# Patient Record
Sex: Female | Born: 1940 | Race: White | Hispanic: No | Marital: Married | Smoking: Former smoker
Health system: Southern US, Community
[De-identification: ages and names within clinical notes are randomized; demographics above are authoritative.]

## PROBLEM LIST (undated history)

## (undated) DIAGNOSIS — F419 Anxiety disorder, unspecified: Secondary | ICD-10-CM

## (undated) DIAGNOSIS — M1990 Unspecified osteoarthritis, unspecified site: Secondary | ICD-10-CM

## (undated) DIAGNOSIS — K2100 Gastro-esophageal reflux disease with esophagitis, without bleeding: Secondary | ICD-10-CM

## (undated) DIAGNOSIS — T753XXA Motion sickness, initial encounter: Secondary | ICD-10-CM

## (undated) DIAGNOSIS — R519 Headache, unspecified: Secondary | ICD-10-CM

## (undated) DIAGNOSIS — K219 Gastro-esophageal reflux disease without esophagitis: Secondary | ICD-10-CM

## (undated) DIAGNOSIS — I10 Essential (primary) hypertension: Secondary | ICD-10-CM

## (undated) DIAGNOSIS — R0600 Dyspnea, unspecified: Secondary | ICD-10-CM

## (undated) DIAGNOSIS — Z972 Presence of dental prosthetic device (complete) (partial): Secondary | ICD-10-CM

## (undated) DIAGNOSIS — R42 Dizziness and giddiness: Secondary | ICD-10-CM

## (undated) HISTORY — PX: NASAL FRACTURE SURGERY: SHX718

## (undated) HISTORY — PX: BACK SURGERY: SHX140

## (undated) HISTORY — PX: CHOLECYSTECTOMY: SHX55

## (undated) HISTORY — PX: APPENDECTOMY: SHX54

## (undated) HISTORY — PX: ABDOMINAL HYSTERECTOMY: SHX81

---

## 2008-10-26 ENCOUNTER — Ambulatory Visit: Payer: Self-pay | Admitting: Family Medicine

## 2008-11-14 ENCOUNTER — Emergency Department: Payer: Self-pay | Admitting: Unknown Physician Specialty

## 2009-04-01 ENCOUNTER — Ambulatory Visit: Payer: Self-pay | Admitting: Family Medicine

## 2010-04-05 ENCOUNTER — Ambulatory Visit: Payer: Self-pay | Admitting: Family Medicine

## 2011-04-06 ENCOUNTER — Ambulatory Visit: Payer: Self-pay | Admitting: Family Medicine

## 2011-05-24 ENCOUNTER — Ambulatory Visit: Payer: Self-pay | Admitting: Gastroenterology

## 2011-11-21 ENCOUNTER — Ambulatory Visit: Payer: Self-pay | Admitting: Family Medicine

## 2012-05-02 ENCOUNTER — Ambulatory Visit: Payer: Self-pay | Admitting: Family Medicine

## 2012-11-26 ENCOUNTER — Ambulatory Visit: Payer: Self-pay | Admitting: Family Medicine

## 2013-05-01 ENCOUNTER — Ambulatory Visit: Payer: Self-pay | Admitting: Gastroenterology

## 2013-05-05 LAB — PATHOLOGY REPORT

## 2013-05-06 ENCOUNTER — Ambulatory Visit: Payer: Self-pay | Admitting: Family Medicine

## 2013-10-21 ENCOUNTER — Ambulatory Visit: Payer: Self-pay | Admitting: Family Medicine

## 2014-04-24 ENCOUNTER — Other Ambulatory Visit: Payer: Self-pay | Admitting: Family Medicine

## 2014-04-24 DIAGNOSIS — Z1231 Encounter for screening mammogram for malignant neoplasm of breast: Secondary | ICD-10-CM

## 2014-05-12 ENCOUNTER — Ambulatory Visit
Admission: RE | Admit: 2014-05-12 | Discharge: 2014-05-12 | Disposition: A | Discharge status: Home / Self Care | Payer: Medicare HMO | Source: Ambulatory Visit | Attending: Family Medicine | Admitting: Family Medicine

## 2014-05-12 DIAGNOSIS — Z1231 Encounter for screening mammogram for malignant neoplasm of breast: Secondary | ICD-10-CM | POA: Insufficient documentation

## 2014-11-04 ENCOUNTER — Other Ambulatory Visit: Payer: Self-pay | Admitting: Family Medicine

## 2014-11-04 DIAGNOSIS — I358 Other nonrheumatic aortic valve disorders: Secondary | ICD-10-CM

## 2014-11-10 ENCOUNTER — Ambulatory Visit
Admission: RE | Admit: 2014-11-10 | Discharge: 2014-11-10 | Disposition: A | Discharge status: Home / Self Care | Payer: Medicare HMO | Source: Ambulatory Visit | Attending: Family Medicine | Admitting: Family Medicine

## 2014-11-10 DIAGNOSIS — I340 Nonrheumatic mitral (valve) insufficiency: Secondary | ICD-10-CM | POA: Diagnosis not present

## 2014-11-10 DIAGNOSIS — I071 Rheumatic tricuspid insufficiency: Secondary | ICD-10-CM | POA: Diagnosis not present

## 2014-11-10 DIAGNOSIS — I358 Other nonrheumatic aortic valve disorders: Secondary | ICD-10-CM

## 2014-11-10 NOTE — Progress Notes (Signed)
*  PRELIMINARY RESULTS* Echocardiogram 2D Echocardiogram has been performed.  Donna HousekeeperJerry R Hege 11/10/2014, 11:00 AM

## 2015-04-01 ENCOUNTER — Other Ambulatory Visit: Payer: Self-pay | Admitting: Family Medicine

## 2015-04-01 DIAGNOSIS — Z1231 Encounter for screening mammogram for malignant neoplasm of breast: Secondary | ICD-10-CM

## 2015-05-13 ENCOUNTER — Ambulatory Visit
Admission: RE | Admit: 2015-05-13 | Discharge: 2015-05-13 | Disposition: A | Discharge status: Home / Self Care | Payer: Medicare HMO | Source: Ambulatory Visit | Attending: Family Medicine | Admitting: Family Medicine

## 2015-05-13 DIAGNOSIS — Z1231 Encounter for screening mammogram for malignant neoplasm of breast: Secondary | ICD-10-CM | POA: Insufficient documentation

## 2015-05-19 ENCOUNTER — Other Ambulatory Visit: Payer: Self-pay | Admitting: Family Medicine

## 2015-05-19 DIAGNOSIS — R928 Other abnormal and inconclusive findings on diagnostic imaging of breast: Secondary | ICD-10-CM

## 2015-05-31 ENCOUNTER — Ambulatory Visit
Admission: RE | Admit: 2015-05-31 | Discharge: 2015-05-31 | Disposition: A | Discharge status: Home / Self Care | Payer: Medicare HMO | Source: Ambulatory Visit | Attending: Family Medicine | Admitting: Family Medicine

## 2015-05-31 DIAGNOSIS — R928 Other abnormal and inconclusive findings on diagnostic imaging of breast: Secondary | ICD-10-CM

## 2015-05-31 DIAGNOSIS — N6489 Other specified disorders of breast: Secondary | ICD-10-CM | POA: Diagnosis not present

## 2015-10-15 ENCOUNTER — Other Ambulatory Visit: Payer: Self-pay | Admitting: Family Medicine

## 2015-10-15 DIAGNOSIS — I10 Essential (primary) hypertension: Secondary | ICD-10-CM

## 2015-11-15 ENCOUNTER — Ambulatory Visit
Admission: RE | Admit: 2015-11-15 | Discharge: 2015-11-15 | Disposition: A | Discharge status: Home / Self Care | Payer: Medicare HMO | Source: Ambulatory Visit | Attending: Family Medicine | Admitting: Family Medicine

## 2015-11-15 DIAGNOSIS — I080 Rheumatic disorders of both mitral and aortic valves: Secondary | ICD-10-CM | POA: Diagnosis not present

## 2015-11-15 DIAGNOSIS — I10 Essential (primary) hypertension: Secondary | ICD-10-CM | POA: Insufficient documentation

## 2015-11-15 NOTE — Progress Notes (Signed)
  Echocardiogram 2D Echocardiogram has been performed.  Tye SavoyCasey N Donna Lowe 11/15/2015, 11:25 AM

## 2016-06-29 ENCOUNTER — Other Ambulatory Visit: Payer: Self-pay | Admitting: Family Medicine

## 2016-06-29 DIAGNOSIS — Z1231 Encounter for screening mammogram for malignant neoplasm of breast: Secondary | ICD-10-CM

## 2016-07-06 ENCOUNTER — Ambulatory Visit
Admission: RE | Admit: 2016-07-06 | Discharge: 2016-07-06 | Disposition: A | Discharge status: Home / Self Care | Payer: Medicare HMO | Source: Ambulatory Visit | Attending: Family Medicine | Admitting: Family Medicine

## 2016-07-06 DIAGNOSIS — Z1231 Encounter for screening mammogram for malignant neoplasm of breast: Secondary | ICD-10-CM | POA: Diagnosis present

## 2016-09-19 ENCOUNTER — Telehealth: Payer: Self-pay | Admitting: *Deleted

## 2016-09-19 DIAGNOSIS — Z122 Encounter for screening for malignant neoplasm of respiratory organs: Secondary | ICD-10-CM

## 2016-09-19 DIAGNOSIS — Z87891 Personal history of nicotine dependence: Secondary | ICD-10-CM

## 2016-09-19 NOTE — Telephone Encounter (Signed)
Received referral for initial lung cancer screening scan. Contacted patient and obtained smoking history,(former, quit 14 years ago, 46 pack year) as well as answering questions related to screening process. Patient denies signs of lung cancer such as weight loss or hemoptysis. Patient denies comorbidity that would prevent curative treatment if lung cancer were found. Patient is scheduled for shared decision making visit and CT scan on 09/27/16 at 1:30pm.

## 2016-09-27 ENCOUNTER — Encounter: Payer: Self-pay | Admitting: Nurse Practitioner

## 2016-09-27 ENCOUNTER — Inpatient Hospital Stay: Payer: Medicare HMO | Attending: Nurse Practitioner | Admitting: Nurse Practitioner

## 2016-09-27 ENCOUNTER — Ambulatory Visit
Admission: RE | Admit: 2016-09-27 | Discharge: 2016-09-27 | Disposition: A | Discharge status: Home / Self Care | Payer: Medicare HMO | Source: Ambulatory Visit | Attending: Nurse Practitioner | Admitting: Nurse Practitioner

## 2016-09-27 DIAGNOSIS — M47814 Spondylosis without myelopathy or radiculopathy, thoracic region: Secondary | ICD-10-CM | POA: Diagnosis not present

## 2016-09-27 DIAGNOSIS — Z9049 Acquired absence of other specified parts of digestive tract: Secondary | ICD-10-CM | POA: Insufficient documentation

## 2016-09-27 DIAGNOSIS — Z87891 Personal history of nicotine dependence: Secondary | ICD-10-CM | POA: Diagnosis not present

## 2016-09-27 DIAGNOSIS — Z122 Encounter for screening for malignant neoplasm of respiratory organs: Secondary | ICD-10-CM

## 2016-09-27 DIAGNOSIS — K760 Fatty (change of) liver, not elsewhere classified: Secondary | ICD-10-CM | POA: Diagnosis not present

## 2016-09-27 DIAGNOSIS — I700 Atherosclerosis of aorta: Secondary | ICD-10-CM | POA: Insufficient documentation

## 2016-09-27 NOTE — Progress Notes (Signed)
In accordance with CMS guidelines, patient has met eligibility criteria including age, absence of signs or symptoms of lung cancer.  Social History  Substance Use Topics  . Smoking status: Former Smoker    Packs/day: 1.00    Years: 46.00    Quit date: 2004  . Smokeless tobacco: Not on file  . Alcohol use Not on file     A shared decision-making session was conducted prior to the performance of CT scan. This includes one or more decision aids, includes benefits and harms of screening, follow-up diagnostic testing, over-diagnosis, false positive rate, and total radiation exposure.  Counseling on the importance of adherence to annual lung cancer LDCT screening, impact of co-morbidities, and ability or willingness to undergo diagnosis and treatment is imperative for compliance of the program.  Counseling on the importance of continued smoking cessation for former smokers; the importance of smoking cessation for current smokers, and information about tobacco cessation interventions have been given to patient including [place] and [facility] quit [place] programs.  Written order for lung cancer screening with LDCT has been given to the patient and any and all questions have been answered to the best of my abilities.   Yearly follow up will be coordinated by Burgess Estelle, Thoracic Navigator.  Verlon Au NP 09/27/16 4:15 PM

## 2016-09-28 ENCOUNTER — Telehealth: Payer: Self-pay | Admitting: *Deleted

## 2016-09-28 NOTE — Telephone Encounter (Signed)
Notified patient of LDCT lung cancer screening program results. Also notified of incidental findings noted below and is encouraged to discuss further with PCP who will receive a copy of this note and/or the CT report. Patient verbalizes understanding.   IMPRESSION: 1. Lung-RADS 2, benign appearance or behavior. Continue annual screening with low-dose chest CT without contrast in 12 months. 2. Diffuse hepatic steatosis.

## 2017-05-21 ENCOUNTER — Encounter: Payer: Self-pay | Admitting: Emergency Medicine

## 2017-05-21 ENCOUNTER — Ambulatory Visit
Admission: EM | Admit: 2017-05-21 | Discharge: 2017-05-21 | Disposition: A | Discharge status: Home / Self Care | Payer: Medicare HMO | Attending: Family Medicine | Admitting: Family Medicine

## 2017-05-21 ENCOUNTER — Other Ambulatory Visit: Payer: Self-pay

## 2017-05-21 DIAGNOSIS — B354 Tinea corporis: Secondary | ICD-10-CM

## 2017-05-21 HISTORY — DX: Essential (primary) hypertension: I10

## 2017-05-21 MED ORDER — NYSTATIN-TRIAMCINOLONE 100000-0.1 UNIT/GM-% EX CREA: TOPICAL_CREAM | CUTANEOUS | 0 refills | Status: DC

## 2017-05-21 MED ORDER — PREDNISONE 20 MG PO TABS: 20.0000 mg | ORAL_TABLET | Freq: Every day | ORAL | 0 refills | Status: DC

## 2017-05-21 MED ORDER — FLUCONAZOLE 150 MG PO TABS: ORAL_TABLET | ORAL | 1 refills | Status: DC

## 2017-05-21 NOTE — ED Triage Notes (Signed)
Patient in today c/o rash on the right side of her face and under her right breast x 4-5 days. Patient has been using OTC Benadryl spray.

## 2017-05-21 NOTE — ED Provider Notes (Signed)
[facility]    CSN: [id number] Arrival date & time: 05/21/17  0911     History   Chief Complaint Chief Complaint  Patient presents with  . Rash    HPI Donna Lowe is a 77 y.o. female.   77 yo female with a c/o itchy, red, rash under her right breast, on her neck and now on her face for the past 4-5 days. Has been using benadryl spray without any relief. Denies any fevers, chills, wheezing, swelling, pain, drainage, shortness of breath.  Denies any new soaps, detergents, plant exposures. Does state that prior to symptoms she had been applying a cream to her husband's toenails for toenail fungus.   The history is provided by the patient.    Past Medical History:  Diagnosis Date  . Hypertension     Patient Active Problem List   Diagnosis Date Noted  . Personal history of tobacco use 09/27/2016    Past Surgical History:  Procedure Laterality Date  . ABDOMINAL HYSTERECTOMY    . APPENDECTOMY    . BACK SURGERY    . CHOLECYSTECTOMY    . NASAL FRACTURE SURGERY      OB History   None      Home Medications    Prior to Admission medications   Medication Sig Start Date End Date Taking? Authorizing Provider  atenolol (TENORMIN) 25 MG tablet Take 1 tablet by mouth 3 (three) times daily.   Yes [provider]  furosemide (LASIX) 20 MG tablet Take 1 tablet by mouth 2 (two) times daily.   Yes [provider]  omeprazole (PRILOSEC) 20 MG capsule Take 1 capsule by mouth daily.   Yes [provider]  Probiotic Product (PROBIOTIC DAILY PO) Take 1 tablet by mouth daily.   Yes [provider]  cyclobenzaprine (FLEXERIL) 5 MG tablet Take 1 tablet by mouth daily as needed. 05/02/17   [provider]  fluconazole (DIFLUCAN) 150 MG tablet 1 tab po once; may repeat in 1 week 05/21/17   Payton Mccallum, MD  nystatin-triamcinolone [facility] II) cream Apply to affected area daily 05/21/17   Payton Mccallum, MD  predniSONE  (DELTASONE) 20 MG tablet Take 1 tablet (20 mg total) by mouth daily. 05/21/17   Payton Mccallum, MD    Family History Family History  Problem Relation Age of Onset  . Breast cancer Mother 25  . Breast cancer Sister 85    Social History Social History   Tobacco Use  . Smoking status: Former Smoker    Packs/day: 1.00    Years: 46.00    Pack years: 46.00    Last attempt to quit: 2004    Years since quitting: 15.3  . Smokeless tobacco: Never Used  Substance Use Topics  . Alcohol use: Never    Frequency: Never  . Drug use: Never     Allergies   Aspirin; Celecoxib; and Chlorthalidone   Review of Systems Review of Systems   Physical Exam Triage Vital Signs ED Triage Vitals  Enc Vitals Group     BP 05/21/17 0924 (!) 176/55     Pulse Rate 05/21/17 0924 65     Resp 05/21/17 0924 16     Temp 05/21/17 0924 97.9 F (36.6 C)     Temp Source 05/21/17 0924 Oral     SpO2 05/21/17 0924 97 %     Weight 05/21/17 0924 180 lb (81.6 kg)     Height 05/21/17 0924  (1.575 m)  Head Circumference --      Peak Flow --      Pain Score 05/21/17 0923 1     Pain Loc --      Pain Edu? --      Excl. in GC? --    No data found.  Updated Vital Signs BP (!) 176/55 (BP Location: Left Arm)   Pulse 65   Temp 97.9 F (36.6 C) (Oral)   Resp 16   Ht  (1.575 m)   Wt 180 lb (81.6 kg)   SpO2 97%   BMI 32.92 kg/m   Visual Acuity Right Eye Distance:   Left Eye Distance:   Bilateral Distance:    Right Eye Near:   Left Eye Near:    Bilateral Near:     Physical Exam  Constitutional: She appears well-developed and well-nourished. No distress.  Skin: Rash noted. Rash is maculopapular. She is not diaphoretic.     Nursing note and vitals reviewed.    UC Treatments / Results  Labs (all labs ordered are listed, but only abnormal results are displayed) Labs Reviewed - No data to display  EKG None  Radiology No results found.  Procedures Procedures (including  critical care time)  Medications Ordered in UC Medications - No data to display  Initial Impression / Assessment and Plan / UC Course  I have reviewed the triage vital signs and the nursing notes.  Pertinent labs & imaging results that were available during my care of the patient were reviewed by me and considered in my medical decision making (see chart for details).      Final Clinical Impressions(s) / UC Diagnoses   Final diagnoses:  Tinea corporis   Discharge Instructions   None    ED Prescriptions    Medication Sig Dispense Auth. Provider   predniSONE (DELTASONE) 20 MG tablet Take 1 tablet (20 mg total) by mouth daily. 7 tablet Payton Mccallum, MD   nystatin-triamcinolone (MYCOLOG II) cream Apply to affected area daily 30 g Payton Mccallum, MD   fluconazole (DIFLUCAN) 150 MG tablet 1 tab po once; may repeat in 1 week 1 tablet Britten Parady, Pamala Hurry, MD      1. diagnosis reviewed with patient 2. rx as per orders above; reviewed possible side effects, interactions, risks and benefits  3. Recommend supportive treatment with otc antihistamines prn for itching 4. Follow-up prn if symptoms worsen or don't improve   Controlled Substance Prescriptions [place] Controlled Substance Registry consulted? Not Applicable   Payton Mccallum, MD 05/21/17 2026

## 2017-05-28 ENCOUNTER — Other Ambulatory Visit: Payer: Self-pay | Admitting: Family Medicine

## 2017-05-28 DIAGNOSIS — Z1231 Encounter for screening mammogram for malignant neoplasm of breast: Secondary | ICD-10-CM

## 2017-07-19 ENCOUNTER — Ambulatory Visit: Payer: Medicare HMO

## 2017-08-20 ENCOUNTER — Other Ambulatory Visit: Payer: Self-pay

## 2017-08-20 ENCOUNTER — Emergency Department
Admission: EM | Admit: 2017-08-20 | Discharge: 2017-08-20 | Disposition: A | Discharge status: Home / Self Care | Payer: Medicare HMO | Attending: Emergency Medicine | Admitting: Emergency Medicine

## 2017-08-20 ENCOUNTER — Encounter: Payer: Self-pay | Admitting: Emergency Medicine

## 2017-08-20 ENCOUNTER — Emergency Department: Payer: Medicare HMO

## 2017-08-20 DIAGNOSIS — R0602 Shortness of breath: Secondary | ICD-10-CM | POA: Insufficient documentation

## 2017-08-20 DIAGNOSIS — I10 Essential (primary) hypertension: Secondary | ICD-10-CM | POA: Diagnosis present

## 2017-08-20 DIAGNOSIS — Z79899 Other long term (current) drug therapy: Secondary | ICD-10-CM | POA: Insufficient documentation

## 2017-08-20 DIAGNOSIS — Z87891 Personal history of nicotine dependence: Secondary | ICD-10-CM | POA: Insufficient documentation

## 2017-08-20 HISTORY — DX: Gastro-esophageal reflux disease with esophagitis: K21.0

## 2017-08-20 HISTORY — DX: Gastro-esophageal reflux disease with esophagitis, without bleeding: K21.00

## 2017-08-20 LAB — CBC
HEMATOCRIT: 42.6 % (ref 35.0–47.0)
Hemoglobin: 14.7 g/dL (ref 12.0–16.0)
MCH: 29.7 pg (ref 26.0–34.0)
MCHC: 34.5 g/dL (ref 32.0–36.0)
MCV: 86 fL (ref 80.0–100.0)
PLATELETS: 171 10*3/uL (ref 150–440)
RBC: 4.95 MIL/uL (ref 3.80–5.20)
RDW: 14.6 % — ABNORMAL HIGH (ref 11.5–14.5)
WBC: 7.6 10*3/uL (ref 3.6–11.0)

## 2017-08-20 LAB — COMPREHENSIVE METABOLIC PANEL
ALBUMIN: 4.3 g/dL (ref 3.5–5.0)
ALT: 27 U/L (ref 0–44)
AST: 24 U/L (ref 15–41)
Alkaline Phosphatase: 102 U/L (ref 38–126)
Anion gap: 4 — ABNORMAL LOW (ref 5–15)
BUN: 15 mg/dL (ref 8–23)
CHLORIDE: 107 mmol/L (ref 98–111)
CO2: 28 mmol/L (ref 22–32)
CREATININE: 0.84 mg/dL (ref 0.44–1.00)
Calcium: 9.7 mg/dL (ref 8.9–10.3)
GFR calc Af Amer: >60 mL/min (ref >60)
Glucose, Bld: 129 mg/dL — ABNORMAL HIGH (ref 70–99)
POTASSIUM: 3.7 mmol/L (ref 3.5–5.1)
Sodium: 139 mmol/L (ref 135–145)
Total Bilirubin: 0.7 mg/dL (ref 0.3–1.2)
Total Protein: 7.1 g/dL (ref 6.5–8.1)

## 2017-08-20 LAB — TROPONIN I

## 2017-08-20 NOTE — ED Triage Notes (Signed)
Pt in with co htn at home states was over 200 systolic. Pt in with co shob since today, no co pain.

## 2017-08-20 NOTE — ED Notes (Signed)
Pt in with co shob and htn. States normally checks bp at home and states was over 200 systolic. Pt take atenolol for bp states she took it today. She is also on lasix and took extra dose today due to shob. Pt states she has had some epigastric paint but states she has a hx of reflux, is painfree at this time. Pt states she has had some shob has apptm with pulmonologist Monday for the same but states symptoms worsened today. Shob noted on exertion and when she speaks.

## 2017-08-20 NOTE — Discharge Instructions (Addendum)
Please seek medical attention for any high fevers, chest pain, shortness of breath, change in behavior, persistent vomiting, bloody stool or any other new or concerning symptoms.  

## 2017-08-20 NOTE — ED Notes (Signed)
Patient discharged to home per MD order. Patient in stable condition, and deemed medically cleared by ED provider for discharge. Discharge instructions reviewed with patient/family using "Teach Back"; verbalized understanding of medication education and administration, and information about follow-up care. Denies further concerns. ° °

## 2017-08-20 NOTE — ED Provider Notes (Signed)
[facility] Emergency Department Provider Note   ____________________________________________   I have reviewed the triage vital signs and the nursing notes.   HISTORY  Chief Complaint Hypertension   History limited by: Not Limited   HPI Donna Lowe is a 77 y.o. female who presents to the emergency department today because of concerns for high blood pressure.  Patient states that she checks her blood pressure daily.  Today she noticed that it was quite elevated.  Systolic over 200.  She is also complaining of some shortness of breath all this is been going on for a long time.  Appears that is gradually gotten worse.  She does have an appointment with pulmonology next week.  She denies any chest pain.  Denies any headache.   Per medical record review patient has a history of HTN  Past Medical History:  Diagnosis Date  . Hypertension   . Reflux esophagitis     Patient Active Problem List   Diagnosis Date Noted  . Personal history of tobacco use 09/27/2016    Past Surgical History:  Procedure Laterality Date  . ABDOMINAL HYSTERECTOMY    . APPENDECTOMY    . BACK SURGERY    . CHOLECYSTECTOMY    . NASAL FRACTURE SURGERY      Prior to Admission medications   Medication Sig Start Date End Date Taking? Authorizing Provider  atenolol (TENORMIN) 25 MG tablet Take 1 tablet by mouth 3 (three) times daily.    [provider]  cyclobenzaprine (FLEXERIL) 5 MG tablet Take 1 tablet by mouth daily as needed. 05/02/17   [provider]  fluconazole (DIFLUCAN) 150 MG tablet 1 tab po once; may repeat in 1 week 05/21/17   Payton Mccallumonty, [place], MD  furosemide (LASIX) 20 MG tablet Take 1 tablet by mouth 2 (two) times daily.    [provider]  nystatin-triamcinolone (MYCOLOG II) cream Apply to affected area daily 05/21/17   Payton Mccallumonty, [place], MD  omeprazole (PRILOSEC) 20 MG capsule Take 1 capsule by mouth daily.    [provider]  predniSONE (DELTASONE) 20 MG tablet Take 1 tablet (20 mg total) by mouth daily. 05/21/17   Payton Mccallumonty, [place], MD  Probiotic Product (PROBIOTIC DAILY PO) Take 1 tablet by mouth daily.    [provider]    Allergies Aspirin; Celecoxib; and Chlorthalidone  Family History  Problem Relation Age of Onset  . Breast cancer Mother 3380  . Breast cancer Sister 8078    Social History Social History   Tobacco Use  . Smoking status: Former Smoker    Packs/day: 1.00    Years: 46.00    Pack years: 46.00    Last attempt to quit: 2004    Years since quitting: 15.6  . Smokeless tobacco: Never Used  Substance Use Topics  . Alcohol use: Never    Frequency: Never  . Drug use: Never    Review of Systems Constitutional: No fever/chills Eyes: No visual changes. ENT: No sore throat. Cardiovascular: Denies chest pain. Respiratory: Positive shortness of breath. Gastrointestinal: No abdominal pain.  No nausea, no vomiting.  No diarrhea.   Genitourinary: Negative for dysuria. Musculoskeletal: Negative for back pain. Skin: Negative for rash. Neurological: Negative for headaches, focal weakness or numbness.  ____________________________________________   PHYSICAL EXAM:  VITAL SIGNS: ED Triage Vitals  Enc Vitals Group     BP 08/20/17 2159 (!) 213/76     Pulse Rate 08/20/17 2159 70     Resp 08/20/17 2159 18  Temp 08/20/17 2159 (!) 97.4 F (36.3 C)     Temp Source 08/20/17 2159 Oral     SpO2 08/20/17 2159 100 %     Weight 08/20/17 2156 180 lb (81.6 kg)     Height 08/20/17 2156 5\' 2"  (1.575 m)     Head Circumference --      Peak Flow --      Pain Score 08/20/17 2156 0   Constitutional: Alert and oriented.  Eyes: Conjunctivae are normal.  ENT      Head: Normocephalic and atraumatic.      Nose: No congestion/rhinnorhea.      Mouth/Throat: Mucous membranes are moist.      Neck: No stridor. Hematological/Lymphatic/Immunilogical: No cervical lymphadenopathy. Cardiovascular:  Normal rate, regular rhythm.  No murmurs, rubs, or gallops.  Respiratory: Normal respiratory effort without tachypnea nor retractions. Breath sounds are clear and equal bilaterally. No wheezes/rales/rhonchi. Gastrointestinal: Soft and non tender. No rebound. No guarding.  Genitourinary: Deferred Musculoskeletal: Normal range of motion in all extremities. No lower extremity edema. Neurologic:  Normal speech and language. No gross focal neurologic deficits are appreciated.  Skin:  Skin is warm, dry and intact. No rash noted. Psychiatric: Mood and affect are normal. Speech and behavior are normal. Patient exhibits appropriate insight and judgment.  ____________________________________________    LABS (pertinent positives/negatives)  CBC wbc 7.6, hgb 14.7, plt 171 CMP wnl except glu 129 Trop <0.03  ____________________________________________   EKG  None   ____________________________________________    RADIOLOGY  CXR No acute disease   ____________________________________________   PROCEDURES  Procedures  ____________________________________________   INITIAL IMPRESSION / ASSESSMENT AND PLAN / ED COURSE  Pertinent labs & imaging results that were available during my care of the patient were reviewed by me and considered in my medical decision making (see chart for details).   Patient presented for high blood pressure.  Patient has some chronic shortness of breath issues for which she is set up to see pulmonology.  Blood work and chest x-ray without concerning findings.  Discussed blood pressure care with patient.  Will discharge follow-up with primary care.   ____________________________________________   FINAL CLINICAL IMPRESSION(S) / ED DIAGNOSES  Final diagnoses:  Hypertension, unspecified type  Shortness of breath     Note: This dictation was prepared with Dragon dictation. Any transcriptional errors that result from this process are unintentional      Phineas SemenGoodman, Dwight Adamczak, MD 08/21/17 1506

## 2017-08-30 ENCOUNTER — Ambulatory Visit
Admission: RE | Admit: 2017-08-30 | Discharge: 2017-08-30 | Disposition: A | Discharge status: Home / Self Care | Payer: Medicare HMO | Source: Ambulatory Visit | Attending: Family Medicine | Admitting: Family Medicine

## 2017-08-30 DIAGNOSIS — Z1231 Encounter for screening mammogram for malignant neoplasm of breast: Secondary | ICD-10-CM

## 2018-07-23 ENCOUNTER — Other Ambulatory Visit: Payer: Self-pay | Admitting: Family Medicine

## 2018-07-23 DIAGNOSIS — Z1231 Encounter for screening mammogram for malignant neoplasm of breast: Secondary | ICD-10-CM

## 2018-09-02 ENCOUNTER — Ambulatory Visit: Payer: Medicare HMO

## 2018-09-19 ENCOUNTER — Ambulatory Visit
Admission: RE | Admit: 2018-09-19 | Discharge: 2018-09-19 | Disposition: A | Discharge status: Home / Self Care | Payer: Medicare HMO | Source: Ambulatory Visit | Attending: Family Medicine | Admitting: Family Medicine

## 2018-09-19 ENCOUNTER — Other Ambulatory Visit: Payer: Self-pay

## 2018-09-19 ENCOUNTER — Encounter (INDEPENDENT_AMBULATORY_CARE_PROVIDER_SITE_OTHER): Payer: Self-pay

## 2018-09-19 DIAGNOSIS — Z1231 Encounter for screening mammogram for malignant neoplasm of breast: Secondary | ICD-10-CM | POA: Diagnosis not present

## 2018-11-14 ENCOUNTER — Encounter: Payer: Self-pay | Admitting: Emergency Medicine

## 2018-11-14 ENCOUNTER — Other Ambulatory Visit: Payer: Self-pay

## 2018-11-14 ENCOUNTER — Ambulatory Visit
Admission: EM | Admit: 2018-11-14 | Discharge: 2018-11-14 | Disposition: A | Discharge status: Home / Self Care | Payer: Medicare HMO | Attending: Urgent Care | Admitting: Urgent Care

## 2018-11-14 DIAGNOSIS — L03032 Cellulitis of left toe: Secondary | ICD-10-CM | POA: Diagnosis not present

## 2018-11-14 MED ORDER — CEPHALEXIN 500 MG PO CAPS: 500.0000 mg | ORAL_CAPSULE | Freq: Four times a day (QID) | ORAL | 0 refills | Status: DC

## 2018-11-14 NOTE — ED Triage Notes (Signed)
Patient c/o sore on her middle toe that she has had since the summer. She states the area is painful.

## 2018-11-14 NOTE — Discharge Instructions (Signed)
It was very nice seeing you today in clinic. Thank you for entrusting me with your care.   Soak foot in warm [place] salt water daily for the next week. Take antibiotics as prescribed. May use Tylenol and/or Ibuprofen as needed for pain.   Make arrangements to follow up with your regular doctor and/or dermatology in 1 week for re-evaluation. If your symptoms/condition worsens, please seek follow up care either here or in the ER. Please remember, our [facility] providers are "right here with you" when you need [place].   Again, it was my pleasure to take care of you today. Thank you for choosing our clinic. I hope that you start to feel better quickly.   Honor Loh, MSN, APRN, FNP-C, CEN Advanced Practice Provider [facility]

## 2018-11-14 NOTE — ED Provider Notes (Signed)
[place], [place]   Name: Donna Lowe DOB: [birth year] MRN: [id number] CSN: [id number] PCP: Lynnell Jude, MD  Arrival date and time:  11/14/18 1231  Chief Complaint:  Toe Pain   NOTE: Prior to seeing the patient today, I have reviewed the triage nursing documentation and vital signs. Clinical staff has updated patient's PMH/PSHx, current medication list, and drug allergies/intolerances to ensure comprehensive history available to assist in medical decision making.   History:   HPI: Donna Lowe is a 78 y.o. female who presents today with complaints of pain and swelling to the 3rd digit on her LEFT foot. Patient denies injury. Symptoms present to varying degrees sine the summer months. She notes an acute exacerbation over the course of the last few days. She has been seen in consult by dermatology Aubery Lapping, MD) and advised that she had a "cyst" in her toe. Since being seen by dermatologist, patient has developed additional areas of concern in her toe. She currently has 4 areas in her toe that are bothering her. She has not appreciated any drainage from the areas. She has not had any fevers or other symptoms consistent with systemic infection. Patient advising that she called her PCP and they could not see her until her scheduled appointment on Tuesday of next week. She also called dermatologist's office and was advised that there were no available appointments. Feeling like she "could not wait another day" because of the increasing pain, she decided to present to urgent care for further evaluation.   Past Medical History:  Diagnosis Date  . Hypertension   . Reflux esophagitis     Past Surgical History:  Procedure Laterality Date  . ABDOMINAL HYSTERECTOMY    . APPENDECTOMY    . BACK SURGERY    . CHOLECYSTECTOMY    . NASAL FRACTURE SURGERY      Family History  Problem Relation Age of Onset  . Breast cancer Mother 81  . Breast cancer Sister 59     Social History   Tobacco Use  . Smoking status: Former Smoker    Packs/day: 1.00    Years: 46.00    Pack years: 46.00    Quit date: 2004    Years since quitting: 16.8  . Smokeless tobacco: Never Used  Substance Use Topics  . Alcohol use: Never    Frequency: Never  . Drug use: Never    Patient Active Problem List   Diagnosis Date Noted  . Personal history of tobacco use 09/27/2016    Home Medications:    Current Meds  Medication Sig  . atenolol (TENORMIN) 25 MG tablet Take 1 tablet by mouth 3 (three) times daily.  . cyclobenzaprine (FLEXERIL) 5 MG tablet Take 1 tablet by mouth daily as needed.  . furosemide (LASIX) 20 MG tablet Take 1 tablet by mouth 2 (two) times daily.  Marland Kitchen nystatin-triamcinolone (MYCOLOG II) cream Apply to affected area daily  . omeprazole (PRILOSEC) 20 MG capsule Take 1 capsule by mouth daily.  . [DISCONTINUED] Probiotic Product (PROBIOTIC DAILY PO) Take 1 tablet by mouth daily.    Allergies:   Aspirin, Celecoxib, and Chlorthalidone  Review of Systems (ROS): Review of Systems  Constitutional: Negative for chills and fever.  Respiratory: Negative for cough and shortness of breath.   Cardiovascular: Negative for chest pain and palpitations.  Gastrointestinal: Negative for nausea and vomiting.  Musculoskeletal:       Pain and swelling to 3rd digit on LEFT foot  Skin: Positive for color change.  Neurological: Negative for dizziness and numbness.  All other systems reviewed and are negative.    Vital Signs: Today's Vitals   11/14/18 1257 11/14/18 1259 11/14/18 1316  BP:  (!) 164/68   Pulse:  71   Resp:  18   Temp:  97.8 F (36.6 C)   TempSrc:  Oral   SpO2:  100%   Weight: 183 lb (83 kg)    Height: 5\' 2"  (1.575 m)    PainSc: 9   9     Physical Exam: Physical Exam  Constitutional: She is oriented to person, place, and time and well-developed, well-nourished, and in no distress.  HENT:  Head: Normocephalic and atraumatic.  Mouth/Throat:  Mucous membranes are normal.  Eyes: Pupils are equal, round, and reactive to light.  Neck: Normal range of motion.  Cardiovascular: Normal rate and intact distal pulses.  Pulmonary/Chest: Effort normal. No respiratory distress.  Musculoskeletal:     Left foot: Normal capillary refill. Tenderness (3rd digit only) and swelling present. No crepitus.     Comments: LEFT middle toe erythematous, swollen, warm, and exquisitely TTP. There are 4 separate areas of nodularity noted. Nodular areas with visible white contents just beneath the skin.   Neurological: She is alert and oriented to person, place, and time. Gait normal.  Skin: Skin is warm and dry. No rash noted.  Psychiatric: Memory, affect and judgment normal. Her mood appears anxious.  Nursing note and vitals reviewed.   Urgent Care Treatments / Results:   LABS: PLEASE NOTE: all labs that were ordered this encounter are listed, however only abnormal results are displayed. Labs Reviewed - No data to display  EKG: -None  RADIOLOGY: No results found.  PROCEDURES: Procedures  MEDICATIONS RECEIVED THIS VISIT: Medications - No data to display  PERTINENT CLINICAL COURSE NOTES/UPDATES:   Initial Impression / Assessment and Plan / Urgent Care Course:  Pertinent labs & imaging results that were available during my care of the patient were personally reviewed by me and considered in my medical decision making (see lab/imaging section of note for values and interpretations).  Donna Lowe is a 78 y.o. female who presents to [facility] today with complaints of Toe Pain   Patient is well appearing overall in clinic today. She does not appear to be in any acute distress. Presenting symptoms (see HPI) and exam as documented above. Left middle toe with 4 nodular areas noted. Digit is erythematous, warm, and exquisitely tender. Discussed evacuation of contents via small stab incision, however patient adamantly refused.  Discussed that pain is not likely not to improve unless pressure relieved by removing abscess/cyst contents from the areas of concern spanning her toe. Patient verbalized understanding and wishes to pursue a more conservative approach to managing her complaints today. Discussed concern for developing cellulitis with the acute exacerbationin her symptoms. Recommended foot soaks 1-2 times a day in warm Epson salt water. Will cover with a 5 day course of cephalexin. If not improving, I recommend that she follow up with dermatology since they are already involved in her case. Alternatively, patient could see podiatry for further evaluation and intervention. She wishes to defer both at this time. Patient with plans to see PCP on Tuesday for routine visit, at which time she will ask her to re-evaluate her toe.   I have reviewed the follow up and strict return precautions for any new or worsening symptoms. Patient is aware of symptoms that would be deemed urgent/emergent, and would thus require further  evaluation either here or in the emergency department. At the time of discharge, she verbalized understanding and consent with the discharge plan as it was reviewed with her. All questions were fielded by provider and/or clinic staff prior to patient discharge.    Final Clinical Impressions / Urgent Care Diagnoses:   Final diagnoses:  Cellulitis of middle toe, left    New Prescriptions:  [place] Controlled Substance Registry consulted? Not Applicable  Meds ordered this encounter  Medications  . cephALEXin (KEFLEX) 500 MG capsule    Sig: Take 1 capsule (500 mg total) by mouth 4 (four) times daily.    Dispense:  20 capsule    Refill:  0    Recommended Follow up Care:  Patient encouraged to follow up with the following provider within the specified time frame, or sooner as dictated by the severity of her symptoms. As always, she was instructed that for any urgent/emergent care needs, she should seek care either  here or in the emergency department for more immediate evaluation.  Follow-up Information    Call  Wanita ChamberlainBenitez-Graham, Ana M, MD.   Specialty: Dermatology Why: Need to be seen in follow up to discuss removal. Contact information: [facility] [address] [place] [id number] [phone]         NOTE: This note was prepared using Dragon dictation software along with smaller phrase technology. Despite my best ability to proofread, there is the potential that transcriptional errors may still occur from this process, and are completely unintentional.    Verlee MonteGray, Reinette Cuneo E, NP 11/15/18 2220

## 2019-09-03 ENCOUNTER — Other Ambulatory Visit: Payer: Self-pay | Admitting: Family Medicine

## 2019-09-03 DIAGNOSIS — Z1231 Encounter for screening mammogram for malignant neoplasm of breast: Secondary | ICD-10-CM

## 2019-10-07 ENCOUNTER — Other Ambulatory Visit: Payer: Self-pay

## 2019-10-07 ENCOUNTER — Ambulatory Visit
Admission: RE | Admit: 2019-10-07 | Discharge: 2019-10-07 | Disposition: A | Discharge status: Home / Self Care | Payer: Medicare HMO | Source: Ambulatory Visit | Attending: Family Medicine | Admitting: Family Medicine

## 2019-10-07 DIAGNOSIS — Z1231 Encounter for screening mammogram for malignant neoplasm of breast: Secondary | ICD-10-CM | POA: Insufficient documentation

## 2019-12-16 ENCOUNTER — Other Ambulatory Visit: Payer: Self-pay

## 2019-12-16 ENCOUNTER — Encounter: Payer: Self-pay | Admitting: Ophthalmology

## 2019-12-18 ENCOUNTER — Other Ambulatory Visit: Payer: Self-pay

## 2019-12-18 ENCOUNTER — Other Ambulatory Visit
Admission: RE | Admit: 2019-12-18 | Discharge: 2019-12-18 | Disposition: A | Discharge status: Home / Self Care | Payer: Medicare HMO | Source: Ambulatory Visit | Attending: Ophthalmology | Admitting: Ophthalmology

## 2019-12-18 DIAGNOSIS — Z01812 Encounter for preprocedural laboratory examination: Secondary | ICD-10-CM | POA: Insufficient documentation

## 2019-12-18 DIAGNOSIS — Z20822 Contact with and (suspected) exposure to covid-19: Secondary | ICD-10-CM | POA: Insufficient documentation

## 2019-12-18 LAB — SARS CORONAVIRUS 2 (TAT 6-24 HRS): SARS Coronavirus 2: NEGATIVE

## 2019-12-18 NOTE — Discharge Instructions (Signed)

## 2019-12-22 ENCOUNTER — Ambulatory Visit: Payer: Medicare HMO | Admitting: Anesthesiology

## 2019-12-22 ENCOUNTER — Other Ambulatory Visit: Payer: Self-pay

## 2019-12-22 ENCOUNTER — Encounter
Admission: RE | Disposition: A | Discharge status: Home / Self Care | Payer: Self-pay | Source: Home / Self Care | Attending: Ophthalmology

## 2019-12-22 ENCOUNTER — Ambulatory Visit
Admission: RE | Admit: 2019-12-22 | Discharge: 2019-12-22 | Disposition: A | Discharge status: Home / Self Care | Payer: Medicare HMO | Attending: Ophthalmology | Admitting: Ophthalmology

## 2019-12-22 ENCOUNTER — Encounter: Payer: Self-pay | Admitting: Ophthalmology

## 2019-12-22 DIAGNOSIS — Z886 Allergy status to analgesic agent status: Secondary | ICD-10-CM | POA: Diagnosis not present

## 2019-12-22 DIAGNOSIS — Z87891 Personal history of nicotine dependence: Secondary | ICD-10-CM | POA: Insufficient documentation

## 2019-12-22 DIAGNOSIS — H2512 Age-related nuclear cataract, left eye: Secondary | ICD-10-CM | POA: Diagnosis not present

## 2019-12-22 DIAGNOSIS — Z888 Allergy status to other drugs, medicaments and biological substances status: Secondary | ICD-10-CM | POA: Diagnosis not present

## 2019-12-22 DIAGNOSIS — Z79899 Other long term (current) drug therapy: Secondary | ICD-10-CM | POA: Diagnosis not present

## 2019-12-22 HISTORY — DX: Presence of dental prosthetic device (complete) (partial): Z97.2

## 2019-12-22 HISTORY — DX: Unspecified osteoarthritis, unspecified site: M19.90

## 2019-12-22 HISTORY — PX: CATARACT EXTRACTION W/PHACO: SHX586

## 2019-12-22 HISTORY — DX: Headache, unspecified: R51.9

## 2019-12-22 HISTORY — DX: Dizziness and giddiness: R42

## 2019-12-22 HISTORY — DX: Motion sickness, initial encounter: T75.3XXA

## 2019-12-22 SURGERY — PHACOEMULSIFICATION, CATARACT, WITH IOL INSERTION
Anesthesia: Monitor Anesthesia Care | Site: Eye | Laterality: Left

## 2019-12-22 MED ORDER — MOXIFLOXACIN HCL 0.5 % OP SOLN
OPHTHALMIC | Status: DC | PRN
  Administered 2019-12-22: 0.2 mL via OPHTHALMIC

## 2019-12-22 MED ORDER — LIDOCAINE HCL (PF) 2 % IJ SOLN
INTRAOCULAR | Status: DC | PRN
  Administered 2019-12-22: 10:00:00 1 mL via INTRAOCULAR

## 2019-12-22 MED ORDER — SODIUM HYALURONATE 23 MG/ML IO SOLN
INTRAOCULAR | Status: DC | PRN
  Administered 2019-12-22: 0.6 mL via INTRAOCULAR

## 2019-12-22 MED ORDER — FENTANYL CITRATE (PF) 100 MCG/2ML IJ SOLN
INTRAMUSCULAR | Status: DC | PRN
  Administered 2019-12-22: 50 ug via INTRAVENOUS

## 2019-12-22 MED ORDER — TETRACAINE HCL 0.5 % OP SOLN
1.0000 [drp] | OPHTHALMIC | Status: DC | PRN
  Administered 2019-12-22 (×3): 1 [drp] via OPHTHALMIC

## 2019-12-22 MED ORDER — SODIUM HYALURONATE 10 MG/ML IO SOLN
INTRAOCULAR | Status: DC | PRN
  Administered 2019-12-22: 0.55 mL via INTRAOCULAR

## 2019-12-22 MED ORDER — EPINEPHRINE PF 1 MG/ML IJ SOLN
INTRAOCULAR | Status: DC | PRN
  Administered 2019-12-22: 10:00:00 83 mL via OPHTHALMIC

## 2019-12-22 MED ORDER — MIDAZOLAM HCL 2 MG/2ML IJ SOLN
INTRAMUSCULAR | Status: DC | PRN
  Administered 2019-12-22: 1 mg via INTRAVENOUS

## 2019-12-22 MED ORDER — ACETAMINOPHEN 160 MG/5ML PO SOLN: 325.0000 mg | ORAL | Status: DC | PRN

## 2019-12-22 MED ORDER — ARMC OPHTHALMIC DILATING DROPS
1.0000 "application " | OPHTHALMIC | Status: DC | PRN
  Administered 2019-12-22 (×3): 1 via OPHTHALMIC

## 2019-12-22 MED ORDER — ACETAMINOPHEN 325 MG PO TABS: 325.0000 mg | ORAL_TABLET | ORAL | Status: DC | PRN

## 2019-12-22 MED ORDER — LACTATED RINGERS IV SOLN: INTRAVENOUS | Status: DC

## 2019-12-22 SURGICAL SUPPLY — 19 items
CANNULA ANT/CHMB 27G (MISCELLANEOUS) ×2 IMPLANT
CANNULA ANT/CHMB 27GA (MISCELLANEOUS) ×6 IMPLANT
DISSECTOR HYDRO NUCLEUS 50X22 (MISCELLANEOUS) ×3 IMPLANT
GLOVE SURG LX 7.5 STRW (GLOVE) ×4
GLOVE SURG LX STRL 7.5 STRW (GLOVE) ×1 IMPLANT
GLOVE SURG SYN 8.5  E (GLOVE) ×2
GLOVE SURG SYN 8.5 E (GLOVE) ×1 IMPLANT
GLOVE SURG SYN 8.5 PF PI (GLOVE) ×1 IMPLANT
GOWN STRL REUS W/ TWL LRG LVL3 (GOWN DISPOSABLE) ×2 IMPLANT
GOWN STRL REUS W/TWL LRG LVL3 (GOWN DISPOSABLE) ×6
LENS IOL TECNIS EYHANCE 20.0 (Intraocular Lens) ×2 IMPLANT
MARKER SKIN DUAL TIP RULER LAB (MISCELLANEOUS) ×3 IMPLANT
PACK DR. KING ARMS (PACKS) ×3 IMPLANT
PACK EYE AFTER SURG (MISCELLANEOUS) ×3 IMPLANT
PACK OPTHALMIC (MISCELLANEOUS) ×3 IMPLANT
SYR 3ML LL SCALE MARK (SYRINGE) ×3 IMPLANT
SYR TB 1ML LUER SLIP (SYRINGE) ×3 IMPLANT
WATER STERILE IRR 250ML POUR (IV SOLUTION) ×3 IMPLANT
WIPE NON LINTING 3.25X3.25 (MISCELLANEOUS) ×3 IMPLANT

## 2019-12-22 NOTE — Anesthesia Postprocedure Evaluation (Signed)
Anesthesia Post Note  Patient: Donna Lowe  Procedure(s) Performed: CATARACT EXTRACTION PHACO AND INTRAOCULAR LENS PLACEMENT (IOC) LEFT (Left Eye)     Patient location during evaluation: PACU Anesthesia Type: MAC Level of consciousness: awake and alert Pain management: pain level controlled Vital Signs Assessment: post-procedure vital signs reviewed and stable Respiratory status: spontaneous breathing, nonlabored ventilation, respiratory function stable and patient connected to nasal cannula oxygen Cardiovascular status: stable and blood pressure returned to baseline Postop Assessment: no apparent nausea or vomiting Anesthetic complications: no   No complications documented.  Wanda Plump Carlyn Mullenbach

## 2019-12-22 NOTE — Anesthesia Preprocedure Evaluation (Signed)
Anesthesia Evaluation  Patient identified by MRN, date of birth, ID band  History of Anesthesia Complications Negative for: history of anesthetic complications  Airway Mallampati: III  TM Distance: >3 FB Neck ROM: Limited    Dental   Pulmonary former smoker,    Pulmonary exam normal        Cardiovascular hypertension, Normal cardiovascular exam     Neuro/Psych    GI/Hepatic negative GI ROS,   Endo/Other    Renal/GU      Musculoskeletal  (+) Arthritis ,   Abdominal   Peds  Hematology   Anesthesia Other Findings   Reproductive/Obstetrics                             Anesthesia Physical Anesthesia Plan  ASA: II  Anesthesia Plan: MAC   Post-op Pain Management:    Induction: Intravenous  PONV Risk Score and Plan:   Airway Management Planned: Natural Airway  Additional Equipment:   Intra-op Plan:   Post-operative Plan:   Informed Consent: I have reviewed the patients History and Physical, chart, labs and discussed the procedure including the risks, benefits and alternatives for the proposed anesthesia with the patient or authorized representative who has indicated his/her understanding and acceptance.       Plan Discussed with:   Anesthesia Plan Comments:         Anesthesia Quick Evaluation

## 2019-12-22 NOTE — H&P (Signed)
[facility]   Primary Care Physician:  Dortha Kern, MD Ophthalmologist: Dr. Willey Blade  Pre-Procedure History & Physical: HPI:  Donna Lowe is a 79 y.o. female here for cataract surgery.   Past Medical History:  Diagnosis Date  . Arthritis    hands  . Headache    "optical" - about 1x/month  . Hypertension   . Motion sickness    all vehicles unless she is driving  . Reflux esophagitis   . Vertigo    1-2x/yr  . Wears dentures    full upper, partial lower (doesn't wear lower)    Past Surgical History:  Procedure Laterality Date  . ABDOMINAL HYSTERECTOMY    . APPENDECTOMY    . BACK SURGERY    . CHOLECYSTECTOMY    . NASAL FRACTURE SURGERY      Prior to Admission medications   Medication Sig Start Date End Date Taking? Authorizing Provider  atenolol (TENORMIN) 25 MG tablet Take 1 tablet by mouth 2 (two) times daily.    Yes [provider]  chlorthalidone (HYGROTON) 25 MG tablet Take 50 mg by mouth daily.   Yes [provider]  hydrALAZINE (APRESOLINE) 10 MG tablet Take 20 mg by mouth 3 (three) times daily.   Yes [provider]  omeprazole (PRILOSEC) 20 MG capsule Take 1 capsule by mouth daily.   Yes [provider]  cyclobenzaprine (FLEXERIL) 5 MG tablet Take 1 tablet by mouth daily as needed. Patient not taking: Reported on 12/16/2019 05/02/17   [provider]  furosemide (LASIX) 20 MG tablet Take 1 tablet by mouth 2 (two) times daily. Patient not taking: Reported on 12/16/2019    [provider]  nystatin-triamcinolone (MYCOLOG II) cream Apply to affected area daily Patient not taking: Reported on 12/16/2019 05/21/17   Payton Mccallum, MD    Allergies as of 10/09/2019 - Review Complete 11/14/2018  Allergen Reaction Noted  . Aspirin Nausea Only 05/21/2017  . Celecoxib  05/08/2016  . Chlorthalidone Nausea Only 05/21/2017    Family History  Problem Relation Age of Onset  . Breast cancer  Mother 18  . Breast cancer Sister 38    Social History   Socioeconomic History  . Marital status: Married    Spouse name: Not on file  . Number of children: Not on file  . Years of education: Not on file  . Highest education level: Not on file  Occupational History  . Not on file  Tobacco Use  . Smoking status: Former Smoker    Packs/day: 1.00    Years: 46.00    Pack years: 46.00    Quit date: 2004    Years since quitting: 17.9  . Smokeless tobacco: Never Used  Vaping Use  . Vaping Use: Never used  Substance and Sexual Activity  . Alcohol use: Never  . Drug use: Never  . Sexual activity: Not on file  Other Topics Concern  . Not on file  Social History Narrative  . Not on file   Social Determinants of Health   Financial Resource Strain: Not on file  Food Insecurity: Not on file  Transportation Needs: Not on file  Physical Activity: Not on file  Stress: Not on file  Social Connections: Not on file  Intimate Partner Violence: Not on file    Review of Systems: See HPI, otherwise negative ROS  Physical Exam: BP (!) 148/57   Pulse 68   Temp (!) 96.9 F (36.1 C) (Temporal)  Resp 15   Ht 5\' 2"  (1.575 m)   Wt 81.6 kg   SpO2 100%   BMI 32.92 kg/m  General:   Alert,  pleasant and cooperative in NAD Head:  Normocephalic and atraumatic. Respiratory:  Normal work of breathing.  Impression/Plan: Donna Lowe is here for cataract surgery.  Risks, benefits, limitations, and alternatives regarding cataract surgery have been reviewed with the patient.  Questions have been answered.  All parties agreeable.   , MD  12/22/2019, 9:43 AM

## 2019-12-22 NOTE — Anesthesia Procedure Notes (Signed)
Procedure Name: MAC Date/Time: 12/22/2019 9:57 AM Performed by: Silvana Newness, CRNA Pre-anesthesia Checklist: Patient identified, Emergency Drugs available, Suction available, Patient being monitored and Timeout performed Patient Re-evaluated:Patient Re-evaluated prior to induction Oxygen Delivery Method: Nasal cannula Placement Confirmation: positive ETCO2

## 2019-12-22 NOTE — Op Note (Signed)
OPERATIVE NOTE  Donna Lowe [id number] 12/22/2019   PREOPERATIVE DIAGNOSIS:  Nuclear sclerotic cataract left eye.  H25.12   POSTOPERATIVE DIAGNOSIS:    Nuclear sclerotic cataract left eye.     PROCEDURE:  Phacoemusification with posterior chamber intraocular lens placement of the left eye   LENS:   Implant Name Type Inv. Item Serial No. Manufacturer Lot No. LRB No. Used Action  LENS IOL TECNIS EYHANCE 20.0 - [id number] Intraocular Lens LENS IOL TECNIS EYHANCE 20.0 [id number] [id number]   Left 1 Implanted      Procedure(s) with comments: CATARACT EXTRACTION PHACO AND INTRAOCULAR LENS PLACEMENT (IOC) LEFT (Left) - 5.55 0:44.6  DIB00 +20.0   ULTRASOUND TIME: 0 minutes 44 seconds.  CDE 5.55   SURGEON:  Willey Blade, MD, MPH   ANESTHESIA:  Topical with tetracaine drops augmented with 1% preservative-free intracameral lidocaine.  ESTIMATED BLOOD LOSS: <1 mL   COMPLICATIONS:  None.   DESCRIPTION OF PROCEDURE:  The patient was identified in the holding room and transported to the operating room and placed in the supine position under the operating microscope.  The left eye was identified as the operative eye and it was prepped and draped in the usual sterile ophthalmic fashion.   A 1.0 millimeter clear-corneal paracentesis was made at the 5:00 position. 0.5 ml of preservative-free 1% lidocaine with epinephrine was injected into the anterior chamber.  The anterior chamber was filled with Healon 5 viscoelastic.  A 2.4 millimeter keratome was used to make a near-clear corneal incision at the 2:00 position.  A curvilinear capsulorrhexis was made with a cystotome and capsulorrhexis forceps.  Balanced salt solution was used to hydrodissect and hydrodelineate the nucleus.   Phacoemulsification was then used in stop and chop fashion to remove the lens nucleus and epinucleus.  The remaining cortex was then removed using the irrigation and aspiration handpiece. Healon was then placed  into the capsular bag to distend it for lens placement.    The first lens failed to inject (the injector went over the lens and no lens was injected into the eye). The duplicate DIB00 lens was then injected into the capsular bag without difficulty.  The remaining viscoelastic was aspirated.   Wounds were hydrated with balanced salt solution.  The anterior chamber was inflated to a physiologic pressure with balanced salt solution.  Intracameral vigamox 0.1 mL undiltued was injected into the eye and a drop placed onto the ocular surface.  No wound leaks were noted.  The patient was taken to the recovery room in stable condition without complications of anesthesia or surgery  Willey Blade 12/22/2019, 10:17 AM

## 2019-12-22 NOTE — Transfer of Care (Signed)
Immediate Anesthesia Transfer of Care Note  Patient: Donna Lowe  Procedure(s) Performed: CATARACT EXTRACTION PHACO AND INTRAOCULAR LENS PLACEMENT (IOC) LEFT (Left Eye)  Patient Location: PACU  Anesthesia Type: MAC  Level of Consciousness: awake, alert  and patient cooperative  Airway and Oxygen Therapy: Patient Spontanous Breathing and Patient connected to supplemental oxygen  Post-op Assessment: Post-op Vital signs reviewed, Patient's Cardiovascular Status Stable, Respiratory Function Stable, Patent Airway and No signs of Nausea or vomiting  Post-op Vital Signs: Reviewed and stable  Complications: No complications documented.

## 2019-12-30 ENCOUNTER — Encounter: Payer: Self-pay | Admitting: Ophthalmology

## 2020-01-07 NOTE — Discharge Instructions (Signed)

## 2020-01-08 ENCOUNTER — Other Ambulatory Visit: Payer: Self-pay

## 2020-01-08 ENCOUNTER — Other Ambulatory Visit
Admission: RE | Admit: 2020-01-08 | Discharge: 2020-01-08 | Disposition: A | Discharge status: Home / Self Care | Payer: Medicare HMO | Source: Ambulatory Visit | Attending: Ophthalmology | Admitting: Ophthalmology

## 2020-01-08 DIAGNOSIS — Z20822 Contact with and (suspected) exposure to covid-19: Secondary | ICD-10-CM | POA: Diagnosis not present

## 2020-01-08 DIAGNOSIS — Z01812 Encounter for preprocedural laboratory examination: Secondary | ICD-10-CM | POA: Diagnosis present

## 2020-01-08 LAB — SARS CORONAVIRUS 2 (TAT 6-24 HRS): SARS Coronavirus 2: NEGATIVE

## 2020-01-12 ENCOUNTER — Ambulatory Visit: Payer: Medicare HMO | Admitting: Anesthesiology

## 2020-01-12 ENCOUNTER — Ambulatory Visit
Admission: RE | Admit: 2020-01-12 | Discharge: 2020-01-12 | Disposition: A | Discharge status: Home / Self Care | Payer: Medicare HMO | Attending: Ophthalmology | Admitting: Ophthalmology

## 2020-01-12 ENCOUNTER — Encounter
Admission: RE | Disposition: A | Discharge status: Home / Self Care | Payer: Self-pay | Source: Home / Self Care | Attending: Ophthalmology

## 2020-01-12 ENCOUNTER — Encounter: Payer: Self-pay | Admitting: Ophthalmology

## 2020-01-12 ENCOUNTER — Other Ambulatory Visit: Payer: Self-pay

## 2020-01-12 DIAGNOSIS — Z87891 Personal history of nicotine dependence: Secondary | ICD-10-CM | POA: Diagnosis not present

## 2020-01-12 DIAGNOSIS — Z79899 Other long term (current) drug therapy: Secondary | ICD-10-CM | POA: Diagnosis not present

## 2020-01-12 DIAGNOSIS — Z886 Allergy status to analgesic agent status: Secondary | ICD-10-CM | POA: Diagnosis not present

## 2020-01-12 DIAGNOSIS — Z90710 Acquired absence of both cervix and uterus: Secondary | ICD-10-CM | POA: Insufficient documentation

## 2020-01-12 DIAGNOSIS — H2511 Age-related nuclear cataract, right eye: Secondary | ICD-10-CM | POA: Insufficient documentation

## 2020-01-12 DIAGNOSIS — Z9049 Acquired absence of other specified parts of digestive tract: Secondary | ICD-10-CM | POA: Insufficient documentation

## 2020-01-12 HISTORY — PX: CATARACT EXTRACTION W/PHACO: SHX586

## 2020-01-12 SURGERY — PHACOEMULSIFICATION, CATARACT, WITH IOL INSERTION
Anesthesia: Monitor Anesthesia Care | Site: Eye | Laterality: Right

## 2020-01-12 MED ORDER — BRIMONIDINE TARTRATE-TIMOLOL 0.2-0.5 % OP SOLN
OPHTHALMIC | Status: DC | PRN
  Administered 2020-01-12: 1 [drp] via OPHTHALMIC

## 2020-01-12 MED ORDER — ARMC OPHTHALMIC DILATING DROPS
1.0000 "application " | OPHTHALMIC | Status: DC | PRN
  Administered 2020-01-12 (×3): 1 via OPHTHALMIC

## 2020-01-12 MED ORDER — LIDOCAINE HCL (PF) 2 % IJ SOLN
INTRAOCULAR | Status: DC | PRN
  Administered 2020-01-12: 1 mL via INTRAOCULAR

## 2020-01-12 MED ORDER — TETRACAINE HCL 0.5 % OP SOLN
1.0000 [drp] | OPHTHALMIC | Status: DC | PRN
  Administered 2020-01-12 (×3): 1 [drp] via OPHTHALMIC

## 2020-01-12 MED ORDER — SODIUM HYALURONATE 23 MG/ML IO SOLN
INTRAOCULAR | Status: DC | PRN
  Administered 2020-01-12: 0.6 mL via INTRAOCULAR

## 2020-01-12 MED ORDER — SODIUM HYALURONATE 10 MG/ML IO SOLN
INTRAOCULAR | Status: DC | PRN
  Administered 2020-01-12: 0.55 mL via INTRAOCULAR

## 2020-01-12 MED ORDER — MIDAZOLAM HCL 2 MG/2ML IJ SOLN
INTRAMUSCULAR | Status: DC | PRN
  Administered 2020-01-12: 2 mg via INTRAVENOUS

## 2020-01-12 MED ORDER — LACTATED RINGERS IV SOLN: INTRAVENOUS | Status: DC

## 2020-01-12 MED ORDER — MOXIFLOXACIN HCL 0.5 % OP SOLN
OPHTHALMIC | Status: DC | PRN
  Administered 2020-01-12: 0.2 mL via OPHTHALMIC

## 2020-01-12 MED ORDER — FENTANYL CITRATE (PF) 100 MCG/2ML IJ SOLN
INTRAMUSCULAR | Status: DC | PRN
  Administered 2020-01-12: 50 ug via INTRAVENOUS

## 2020-01-12 MED ORDER — EPINEPHRINE PF 1 MG/ML IJ SOLN
INTRAOCULAR | Status: DC | PRN
  Administered 2020-01-12: 79 mL via OPHTHALMIC

## 2020-01-12 MED ORDER — TRIAMCINOLONE ACETONIDE 40 MG/ML IJ SUSP
INTRAMUSCULAR | Status: DC | PRN
  Administered 2020-01-12: 40 mg

## 2020-01-12 SURGICAL SUPPLY — 19 items

## 2020-01-12 NOTE — Anesthesia Procedure Notes (Signed)
Procedure Name: MAC Date/Time: 01/12/2020 9:42 AM Performed by: Jeannene Patella, CRNA Pre-anesthesia Checklist: Patient identified, Emergency Drugs available, Suction available, Timeout performed and Patient being monitored Patient Re-evaluated:Patient Re-evaluated prior to induction Oxygen Delivery Method: Nasal cannula Placement Confirmation: positive ETCO2

## 2020-01-12 NOTE — Anesthesia Postprocedure Evaluation (Signed)
Anesthesia Post Note  Patient: Donna Lowe  Procedure(s) Performed: CATARACT EXTRACTION PHACO AND INTRAOCULAR LENS PLACEMENT (IOC) RIGHT (Right Eye)     Patient location during evaluation: PACU Anesthesia Type: MAC Level of consciousness: awake and alert Pain management: pain level controlled Vital Signs Assessment: post-procedure vital signs reviewed and stable Respiratory status: spontaneous breathing Cardiovascular status: stable Anesthetic complications: no   No complications documented.  Gillian Scarce

## 2020-01-12 NOTE — Anesthesia Preprocedure Evaluation (Signed)
Anesthesia Evaluation  Patient identified by MRN, date of birth, ID band Patient awake    Reviewed: Allergy & Precautions, H&P , NPO status , Patient's Chart, lab work & pertinent test results  Airway Mallampati: II  TM Distance: >3 FB Neck ROM: full    Dental no notable dental hx.    Pulmonary neg pulmonary ROS, former smoker,    Pulmonary exam normal        Cardiovascular hypertension, On Medications Normal cardiovascular exam Rhythm:regular Rate:Normal     Neuro/Psych  Headaches,    GI/Hepatic negative GI ROS, Neg liver ROS,   Endo/Other  negative endocrine ROS  Renal/GU negative Renal ROS     Musculoskeletal   Abdominal   Peds  Hematology negative hematology ROS (+)   Anesthesia Other Findings   Reproductive/Obstetrics                             Anesthesia Physical Anesthesia Plan  ASA: II  Anesthesia Plan: MAC   Post-op Pain Management:    Induction:   PONV Risk Score and Plan: Treatment may vary due to age or medical condition  Airway Management Planned:   Additional Equipment:   Intra-op Plan:   Post-operative Plan:   Informed Consent: I have reviewed the patients History and Physical, chart, labs and discussed the procedure including the risks, benefits and alternatives for the proposed anesthesia with the patient or authorized representative who has indicated his/her understanding and acceptance.       Plan Discussed with:   Anesthesia Plan Comments:         Anesthesia Quick Evaluation

## 2020-01-12 NOTE — Transfer of Care (Signed)
Immediate Anesthesia Transfer of Care Note  Patient: Donna Lowe  Procedure(s) Performed: CATARACT EXTRACTION PHACO AND INTRAOCULAR LENS PLACEMENT (IOC) RIGHT (Right Eye)  Patient Location: PACU  Anesthesia Type: MAC  Level of Consciousness: awake, alert  and patient cooperative  Airway and Oxygen Therapy: Patient Spontanous Breathing and Patient connected to supplemental oxygen  Post-op Assessment: Post-op Vital signs reviewed, Patient's Cardiovascular Status Stable, Respiratory Function Stable, Patent Airway and No signs of Nausea or vomiting  Post-op Vital Signs: Reviewed and stable  Complications: No complications documented.

## 2020-01-12 NOTE — H&P (Signed)
[facility]   Primary Care Physician:  Dortha Kern, MD Ophthalmologist: Dr. Willey Blade  Pre-Procedure History & Physical: HPI:  Donna Lowe is a 80 y.o. female here for cataract surgery.   Past Medical History:  Diagnosis Date  . Arthritis    hands  . Headache    "optical" - about 1x/month  . Hypertension   . Motion sickness    all vehicles unless she is driving  . Reflux esophagitis   . Vertigo    1-2x/yr  . Wears dentures    full upper, partial lower (doesn't wear lower)    Past Surgical History:  Procedure Laterality Date  . ABDOMINAL HYSTERECTOMY    . APPENDECTOMY    . BACK SURGERY    . CATARACT EXTRACTION W/PHACO Left 12/22/2019   Procedure: CATARACT EXTRACTION PHACO AND INTRAOCULAR LENS PLACEMENT (IOC) LEFT;  Surgeon: Nevada Crane, MD;  Location: [facility];  Service: Ophthalmology;  Laterality: Left;  5.55 0:44.6  . CHOLECYSTECTOMY    . NASAL FRACTURE SURGERY      Prior to Admission medications   Medication Sig Start Date End Date Taking? Authorizing Provider  atenolol (TENORMIN) 25 MG tablet Take 1 tablet by mouth 2 (two) times daily.    Yes [provider]  chlorthalidone (HYGROTON) 25 MG tablet Take 50 mg by mouth daily.   Yes [provider]  cyclobenzaprine (FLEXERIL) 5 MG tablet Take 1 tablet by mouth daily as needed. 05/02/17  Yes [provider]  hydrALAZINE (APRESOLINE) 10 MG tablet Take 20 mg by mouth 3 (three) times daily.   Yes [provider]  omeprazole (PRILOSEC) 20 MG capsule Take 1 capsule by mouth daily.   Yes [provider]  furosemide (LASIX) 20 MG tablet Take 1 tablet by mouth 2 (two) times daily. Patient not taking: Reported on 12/16/2019    [provider]  nystatin-triamcinolone (MYCOLOG II) cream Apply to affected area daily Patient not taking: Reported on 12/16/2019 05/21/17   Payton Mccallum, MD    Allergies as of 12/24/2019 - Review Complete  12/22/2019  Allergen Reaction Noted  . Aspirin Nausea Only 05/21/2017  . Celecoxib  05/08/2016  . Chlorthalidone Nausea Only 05/21/2017    Family History  Problem Relation Age of Onset  . Breast cancer Mother 72  . Breast cancer Sister 14    Social History   Socioeconomic History  . Marital status: Married    Spouse name: Not on file  . Number of children: Not on file  . Years of education: Not on file  . Highest education level: Not on file  Occupational History  . Not on file  Tobacco Use  . Smoking status: Former Smoker    Packs/day: 1.00    Years: 46.00    Pack years: 46.00    Quit date: 2004    Years since quitting: 18.0  . Smokeless tobacco: Never Used  Vaping Use  . Vaping Use: Never used  Substance and Sexual Activity  . Alcohol use: Never  . Drug use: Never  . Sexual activity: Not on file  Other Topics Concern  . Not on file  Social History Narrative  . Not on file   Social Determinants of Health   Financial Resource Strain: Not on file  Food Insecurity: Not on file  Transportation Needs: Not on file  Physical Activity: Not on file  Stress: Not on file  Social Connections: Not on file  Intimate Partner Violence: Not on  file    Review of Systems: See HPI, otherwise negative ROS  Physical Exam: BP (!) 175/62   Pulse 72   Temp 97.8 F (36.6 C) (Temporal)   Ht 5\' 2"  (1.575 m)   Wt 83 kg   SpO2 99%   BMI 33.47 kg/m  General:   Alert,  pleasant and cooperative in NAD Head:  Normocephalic and atraumatic. Respiratory:  Normal work of breathing.  Impression/Plan: Donna Lowe is here for cataract surgery.  Risks, benefits, limitations, and alternatives regarding cataract surgery have been reviewed with the patient.  Questions have been answered.  All parties agreeable.   , MD  01/12/2020, 9:28 AM

## 2020-01-12 NOTE — Anesthesia Procedure Notes (Signed)
Performed by: Jinny Blossom, CRNA

## 2020-01-12 NOTE — Op Note (Signed)
OPERATIVE NOTE  Donna Lowe [id number] 01/12/2020   PREOPERATIVE DIAGNOSIS:  Nuclear sclerotic cataract right eye.  H25.11   POSTOPERATIVE DIAGNOSIS:    Nuclear sclerotic cataract right eye.     PROCEDURE:  Phacoemusification with posterior chamber intraocular lens placement of the right eye   LENS:   Implant Name Type Inv. Item Serial No. Manufacturer Lot No. LRB No. Used Action  LENS IOL TECNIS EYHANCE 20.0 - [id number] Intraocular Lens LENS IOL TECNIS EYHANCE 20.0 [id number] JOHNSON   Right 1 Implanted       Procedure(s) with comments: CATARACT EXTRACTION PHACO AND INTRAOCULAR LENS PLACEMENT (IOC) RIGHT (Right) - 3.71 0:31.2  DIB00 +20.0   ULTRASOUND TIME: 0 minutes 31 seconds.  CDE 3.71   SURGEON:  Willey Blade, MD, MPH  ANESTHESIOLOGIST: Anesthesiologist: Jolayne Panther, MD CRNA: Jinny Blossom, CRNA   ANESTHESIA:  Topical with tetracaine drops augmented with 1% preservative-free intracameral lidocaine.  ESTIMATED BLOOD LOSS: less than 1 mL.   COMPLICATIONS:  None.   DESCRIPTION OF PROCEDURE:  The patient was identified in the holding room and transported to the operating room and placed in the supine position under the operating microscope.  The right eye was identified as the operative eye and it was prepped and draped in the usual sterile ophthalmic fashion.   A 1.0 millimeter clear-corneal paracentesis was made at the 10:30 position. 0.5 ml of preservative-free 1% lidocaine with epinephrine was injected into the anterior chamber.  The anterior chamber was filled with Healon 5 viscoelastic.  A 2.4 millimeter keratome was used to make a near-clear corneal incision at the 8:00 position.  A curvilinear capsulorrhexis was made with a cystotome and capsulorrhexis forceps.  Balanced salt solution was used to hydrodissect and hydrodelineate the nucleus.   Phacoemulsification was then used in stop and chop fashion to remove the lens nucleus and  epinucleus.  The remaining cortex was then removed using the irrigation and aspiration handpiece. Healon was then placed into the capsular bag to distend it for lens placement.  A lens was then injected into the capsular bag.  The remaining viscoelastic was aspirated.   Wounds were hydrated with balanced salt solution.  The anterior chamber was inflated to a physiologic pressure with balanced salt solution.   Intracameral vigamox 0.1 mL undiluted was injected into the eye and a drop placed onto the ocular surface.  Intracameral kenalog 40 mg/mL, 0.05 mL was injected into the eye, given the patients poor tolerance of topical postoperative drops in the first eye.  No wound leaks were noted.  The patient was taken to the recovery room in stable condition without complications of anesthesia or surgery  Willey Blade 01/12/2020, 10:01 AM

## 2020-01-14 ENCOUNTER — Encounter: Payer: Self-pay | Admitting: Ophthalmology

## 2020-08-27 ENCOUNTER — Other Ambulatory Visit: Payer: Self-pay | Admitting: Family Medicine

## 2020-08-27 DIAGNOSIS — Z1231 Encounter for screening mammogram for malignant neoplasm of breast: Secondary | ICD-10-CM

## 2020-10-07 ENCOUNTER — Ambulatory Visit
Admission: RE | Admit: 2020-10-07 | Discharge: 2020-10-07 | Disposition: A | Discharge status: Home / Self Care | Payer: Medicare HMO | Source: Ambulatory Visit | Attending: Family Medicine | Admitting: Family Medicine

## 2020-10-07 ENCOUNTER — Other Ambulatory Visit: Payer: Self-pay

## 2020-10-07 DIAGNOSIS — Z1231 Encounter for screening mammogram for malignant neoplasm of breast: Secondary | ICD-10-CM | POA: Insufficient documentation

## 2021-09-02 ENCOUNTER — Other Ambulatory Visit: Payer: Self-pay | Admitting: Family Medicine

## 2021-09-02 DIAGNOSIS — Z1231 Encounter for screening mammogram for malignant neoplasm of breast: Secondary | ICD-10-CM

## 2021-10-10 ENCOUNTER — Ambulatory Visit
Admission: RE | Admit: 2021-10-10 | Discharge: 2021-10-10 | Disposition: A | Discharge status: Home / Self Care | Payer: Medicare HMO | Source: Ambulatory Visit | Attending: Family Medicine | Admitting: Family Medicine

## 2021-10-10 DIAGNOSIS — Z1231 Encounter for screening mammogram for malignant neoplasm of breast: Secondary | ICD-10-CM | POA: Diagnosis not present

## 2021-10-12 ENCOUNTER — Other Ambulatory Visit: Payer: Self-pay | Admitting: Family Medicine

## 2021-10-12 DIAGNOSIS — R928 Other abnormal and inconclusive findings on diagnostic imaging of breast: Secondary | ICD-10-CM

## 2021-10-12 DIAGNOSIS — N6489 Other specified disorders of breast: Secondary | ICD-10-CM

## 2021-10-31 ENCOUNTER — Ambulatory Visit
Admission: RE | Admit: 2021-10-31 | Discharge: 2021-10-31 | Disposition: A | Discharge status: Home / Self Care | Payer: Medicare HMO | Source: Ambulatory Visit | Attending: Family Medicine | Admitting: Family Medicine

## 2021-10-31 DIAGNOSIS — R928 Other abnormal and inconclusive findings on diagnostic imaging of breast: Secondary | ICD-10-CM | POA: Diagnosis present

## 2021-10-31 DIAGNOSIS — N6489 Other specified disorders of breast: Secondary | ICD-10-CM | POA: Insufficient documentation

## 2022-06-28 ENCOUNTER — Other Ambulatory Visit: Payer: Self-pay | Admitting: Podiatry

## 2022-07-06 ENCOUNTER — Other Ambulatory Visit: Payer: Self-pay

## 2022-07-06 ENCOUNTER — Encounter
Admission: RE | Admit: 2022-07-06 | Discharge: 2022-07-06 | Disposition: A | Discharge status: Home / Self Care | Payer: Medicare HMO | Source: Ambulatory Visit | Attending: Podiatry | Admitting: Podiatry

## 2022-07-06 DIAGNOSIS — I10 Essential (primary) hypertension: Secondary | ICD-10-CM

## 2022-07-06 DIAGNOSIS — Z01812 Encounter for preprocedural laboratory examination: Secondary | ICD-10-CM

## 2022-07-06 HISTORY — DX: Anxiety disorder, unspecified: F41.9

## 2022-07-06 HISTORY — DX: Gastro-esophageal reflux disease without esophagitis: K21.9

## 2022-07-06 HISTORY — DX: Dyspnea, unspecified: R06.00

## 2022-07-06 NOTE — Patient Instructions (Addendum)
Your procedure is scheduled on: 07/14/22 - Friday Report to the Registration Desk on the 1st floor of the [facility]. To find out your arrival time, please call [phone] between 1PM - 3PM on: 07/12/22 - Wednesday If your arrival time is 6:00 am, do not arrive before that time as the Medical Mall entrance doors do not open until 6:00 am.  REMEMBER: Instructions that are not followed completely may result in serious medical risk, up to and including death; or upon the discretion of your surgeon and anesthesiologist your surgery may need to be rescheduled.  Do not eat food after midnight the night before surgery.  No gum chewing or hard candies.  You may however, drink CLEAR liquids up to 2 hours before you are scheduled to arrive for your surgery. Do not drink anything within 2 hours of your scheduled arrival time.  Clear liquids include: - water  - apple juice without pulp - gatorade (not RED colors) - black coffee or tea (Do NOT add milk or creamers to the coffee or tea) Do NOT drink anything that is not on this list.  One week prior to surgery: Stop Anti-inflammatories (NSAIDS) such as Advil, Aleve, Ibuprofen, Motrin, Naproxen, Naprosyn and Aspirin based products such as Excedrin, Goody's Powder, BC Powder.  Stop ANY OVER THE COUNTER supplements until after surgery.  You may however, continue to take Tylenol if needed for pain up until the day of surgery.  TAKE ONLY THESE MEDICATIONS THE MORNING OF SURGERY WITH A SIP OF WATER:  omeprazole (PRILOSEC) - (take one the night before and one on the morning of surgery - helps to prevent nausea after surgery.) hydrALAZINE (APRESOLINE)   No Alcohol for 24 hours before or after surgery.  No Smoking including e-cigarettes for 24 hours before surgery.  No chewable tobacco products for at least 6 hours before surgery.  No nicotine patches on the day of surgery.  Do not use any "recreational" drugs for at least a week (preferably 2  weeks) before your surgery.  Please be advised that the combination of cocaine and anesthesia may have negative outcomes, up to and including death. If you test positive for cocaine, your surgery will be cancelled.  On the morning of surgery brush your teeth with toothpaste and water, you may rinse your mouth with mouthwash if you wish. Do not swallow any toothpaste or mouthwash.  Use CHG Soap or wipes as directed on instruction sheet.  Do not wear jewelry, make-up, hairpins, clips or nail polish.  Do not wear lotions, powders, or perfumes.   Do not shave body hair from the neck down 48 hours before surgery.  Contact lenses, hearing aids and dentures may not be worn into surgery.  Do not bring valuables to the hospital. [facility] is not responsible for any missing/lost belongings or valuables.   Notify your doctor if there is any change in your medical condition (cold, fever, infection).  Wear comfortable clothing (specific to your surgery type) to the hospital.  After surgery, you can help prevent lung complications by doing breathing exercises.  Take deep breaths and cough every 1-2 hours. Your doctor may order a device called an Incentive Spirometer to help you take deep breaths. When coughing or sneezing, hold a pillow firmly against your incision with both hands. This is called "splinting." Doing this helps protect your incision. It also decreases belly discomfort.  If you are being admitted to the hospital overnight, leave your suitcase in the car. After surgery  it may be brought to your room.  In case of increased patient census, it may be necessary for you, the patient, to continue your postoperative care in the Same Day Surgery department.  If you are being discharged the day of surgery, you will not be allowed to drive home. You will need a responsible individual to drive you home and stay with you for 24 hours after surgery.   If you are taking public transportation, you  will need to have a responsible individual with you.  Please call the Pre-admissions Testing Dept. at [phone] if you have any questions about these instructions.  Surgery Visitation Policy:  Patients having surgery or a procedure may have two visitors.  Children under the age of 46 must have an adult with them who is not the patient.  Inpatient Visitation:    Visiting hours are 7 a.m. to 8 p.m. Up to four visitors are allowed at one time in a patient room. The visitors may rotate out with other people during the day.  One visitor age 63 or older may stay with the patient overnight and must be in the room by 8 p.m.    Preparing for Surgery with CHLORHEXIDINE GLUCONATE (CHG) Soap  Chlorhexidine Gluconate (CHG) Soap  o An antiseptic cleaner that kills germs and bonds with the skin to continue killing germs even after washing  o Used for showering the night before surgery and morning of surgery  Before surgery, you can play an important role by reducing the number of germs on your skin.  CHG (Chlorhexidine gluconate) soap is an antiseptic cleanser which kills germs and bonds with the skin to continue killing germs even after washing.  Please do not use if you have an allergy to CHG or antibacterial soaps. If your skin becomes reddened/irritated stop using the CHG.  1. Shower the NIGHT BEFORE SURGERY and the MORNING OF SURGERY with CHG soap.  2. If you choose to wash your hair, wash your hair first as usual with your normal shampoo.  3. After shampooing, rinse your hair and body thoroughly to remove the shampoo.  4. Use CHG as you would any other liquid soap. You can apply CHG directly to the skin and wash gently with a scrungie or a clean washcloth.  5. Apply the CHG soap to your body only from the neck down. Do not use on open wounds or open sores. Avoid contact with your eyes, ears, mouth, and genitals (private parts). Wash face and genitals (private parts) with your normal  soap.  6. Wash thoroughly, paying special attention to the area where your surgery will be performed.  7. Thoroughly rinse your body with warm water.  8. Do not shower/wash with your normal soap after using and rinsing off the CHG soap.  9. Pat yourself dry with a clean towel.  10. Wear clean pajamas to bed the night before surgery.  12. Place clean sheets on your bed the night of your first shower and do not sleep with pets.  13. Shower again with the CHG soap on the day of surgery prior to arriving at the hospital.  14. Do not apply any deodorants/lotions/powders.  15. Please wear clean clothes to the hospital. How to Use an Incentive Spirometer  An incentive spirometer is a tool that measures how well you are filling your lungs with each breath. Learning to take long, deep breaths using this tool can help you keep your lungs clear and active. This may help  to reverse or lessen your chance of developing breathing (pulmonary) problems, especially infection. You may be asked to use a spirometer: After a surgery. If you have a lung problem or a history of smoking. After a long period of time when you have been unable to move or be active. If the spirometer includes an indicator to show the highest number that you have reached, your health care provider or respiratory therapist will help you set a goal. Keep a log of your progress as told by your health care provider. What are the risks? Breathing too quickly may cause dizziness or cause you to pass out. Take your time so you do not get dizzy or light-headed. If you are in pain, you may need to take pain medicine before doing incentive spirometry. It is harder to take a deep breath if you are having pain. How to use your incentive spirometer  Sit up on the edge of your bed or on a chair. Hold the incentive spirometer so that it is in an upright position. Before you use the spirometer, breathe out normally. Place the mouthpiece in your  mouth. Make sure your lips are closed tightly around it. Breathe in slowly and as deeply as you can through your mouth, causing the piston or the ball to rise toward the top of the chamber. Hold your breath for 3-5 seconds, or for as long as possible. If the spirometer includes a coach indicator, use this to guide you in breathing. Slow down your breathing if the indicator goes above the marked areas. Remove the mouthpiece from your mouth and breathe out normally. The piston or ball will return to the bottom of the chamber. Rest for a few seconds, then repeat the steps 10 or more times. Take your time and take a few normal breaths between deep breaths so that you do not get dizzy or light-headed. Do this every 1-2 hours when you are awake. If the spirometer includes a goal marker to show the highest number you have reached (best effort), use this as a goal to work toward during each repetition. After each set of 10 deep breaths, cough a few times. This will help to make sure that your lungs are clear. If you have an incision on your chest or abdomen from surgery, place a pillow or a rolled-up towel firmly against the incision when you cough. This can help to reduce pain while taking deep breaths and coughing. General tips When you are able to get out of bed: Walk around often. Continue to take deep breaths and cough in order to clear your lungs. Keep using the incentive spirometer until your health care provider says it is okay to stop using it. If you have been in the hospital, you may be told to keep using the spirometer at home. Contact a health care provider if: You are having difficulty using the spirometer. You have trouble using the spirometer as often as instructed. Your pain medicine is not giving enough relief for you to use the spirometer as told. You have a fever. Get help right away if: You develop shortness of breath. You develop a cough with bloody mucus from the lungs. You  have fluid or blood coming from an incision site after you cough. Summary An incentive spirometer is a tool that can help you learn to take long, deep breaths to keep your lungs clear and active. You may be asked to use a spirometer after a surgery, if you have a lung  problem or a history of smoking, or if you have been inactive for a long period of time. Use your incentive spirometer as instructed every 1-2 hours while you are awake. If you have an incision on your chest or abdomen, place a pillow or a rolled-up towel firmly against your incision when you cough. This will help to reduce pain. Get help right away if you have shortness of breath, you cough up bloody mucus, or blood comes from your incision when you cough. This information is not intended to replace advice given to you by your health care provider. Make sure you discuss any questions you have with your health care provider. Document Revised: 03/17/2019 Document Reviewed: 03/17/2019 Elsevier Patient Education  2023 [employer].

## 2022-07-06 NOTE — Pre-Procedure Instructions (Signed)
Ensure pre surgery drink refused by patient, reports GI upset in the past.

## 2022-07-10 ENCOUNTER — Encounter
Admission: RE | Admit: 2022-07-10 | Discharge: 2022-07-10 | Disposition: A | Discharge status: Home / Self Care | Payer: Medicare HMO | Source: Ambulatory Visit | Attending: Podiatry | Admitting: Podiatry

## 2022-07-10 ENCOUNTER — Telehealth: Payer: Self-pay | Admitting: Urgent Care

## 2022-07-10 DIAGNOSIS — Z01818 Encounter for other preprocedural examination: Secondary | ICD-10-CM | POA: Diagnosis present

## 2022-07-10 DIAGNOSIS — E876 Hypokalemia: Secondary | ICD-10-CM

## 2022-07-10 DIAGNOSIS — Z79899 Other long term (current) drug therapy: Secondary | ICD-10-CM

## 2022-07-10 DIAGNOSIS — Z01812 Encounter for preprocedural laboratory examination: Secondary | ICD-10-CM

## 2022-07-10 DIAGNOSIS — I10 Essential (primary) hypertension: Secondary | ICD-10-CM | POA: Insufficient documentation

## 2022-07-10 LAB — BASIC METABOLIC PANEL
Anion gap: 8 (ref 5–15)
BUN: 21 mg/dL (ref 8–23)
CO2: 27 mmol/L (ref 22–32)
Calcium: 9.4 mg/dL (ref 8.9–10.3)
Chloride: 101 mmol/L (ref 98–111)
Creatinine, Ser: 1.15 mg/dL — ABNORMAL HIGH (ref 0.44–1.00)
GFR, Estimated: 47 mL/min — ABNORMAL LOW (ref >60)
Glucose, Bld: 116 mg/dL — ABNORMAL HIGH (ref 70–99)
Potassium: 3 mmol/L — ABNORMAL LOW (ref 3.5–5.1)
Sodium: 136 mmol/L (ref 135–145)

## 2022-07-10 LAB — CBC
HCT: 38 % (ref 36.0–46.0)
Hemoglobin: 13.3 g/dL (ref 12.0–15.0)
MCH: 30.8 pg (ref 26.0–34.0)
MCHC: 35 g/dL (ref 30.0–36.0)
MCV: 88 fL (ref 80.0–100.0)
Platelets: 182 10*3/uL (ref 150–400)
RBC: 4.32 MIL/uL (ref 3.87–5.11)
RDW: 14.2 % (ref 11.5–15.5)
WBC: 6.5 10*3/uL (ref 4.0–10.5)
nRBC: 0 % (ref 0.0–0.2)

## 2022-07-10 MED ORDER — POTASSIUM CHLORIDE CRYS ER 20 MEQ PO TBCR: EXTENDED_RELEASE_TABLET | ORAL | 0 refills | Status: AC

## 2022-07-10 MED ORDER — POTASSIUM CHLORIDE CRYS ER 20 MEQ PO TBCR: EXTENDED_RELEASE_TABLET | ORAL | 0 refills | Status: DC

## 2022-07-10 NOTE — Progress Notes (Signed)
[facility] Perioperative Services: Pre-Admission/Anesthesia Testing  Abnormal Lab Notification and Treatment Plan of Care   Date: 07/10/22  Name: Donna Lowe MRN:   [id number]  Re: Abnormal labs noted during PAT appointment   Notified:  Provider Name Provider Role Notification Mode  Gwyneth Revels, DPM Podiatry (Surgeon) Routed and/or faxed via [place] Matuska Lemma, Doreene Nest, MD Primary Care Provider Routed and/or faxed via [facility]   Clinical Information and Notes:  ABNORMAL LAB VALUE(S): Lab Results  Component Value Date   K 3.0 (L) 07/10/2022   Donna Lowe is scheduled for an elective TOE METATARSOPHALANGEAL JOINT  on 07/14/2022. In review of her medication reconciliation, it is noted that the patient is taking prescribed diuretic medications (chlorthalidone 50 mg) daily.   Please note, in efforts to promote a safe and effective anesthetic course, per current guidelines/standards set by the [facility] anesthesia team, the minimal acceptable K+ level for the patient to proceed with general anesthesia is 3.0 mmol/L. With that being said, if the patient drops any lower, her elective procedure will need to be postponed until K+ is better optimized. In efforts to prevent case cancellation, will make efforts to optimize pre-surgical K+ level so that patient can safely undergo the planned surgical intervention.   Impression and Plan:  Donna Lowe found to be HYPOkalemic at 3.0 mmol/L on preoperative labs. She is on thiazide-like diuretic therapy. She is not on oral K+ supplementation. Called patient to discuss results and plans for correction. No answer; LMOM with detailed plan and number to return call to PAT with questions. Given her age, we will treat conservatively with short course of oral therapy.   Meds ordered this encounter  Medications   potassium chloride SA (KLOR-CON M) 20 MEQ tablet    Sig: Take 1 tablet (20 mEq) daily until  surgery. Be sure to take dose on day of procedure. Follow up with PCP for repeat labs.    Dispense:  5 tablet    Refill:  0    Please contact patient once Rx is filled and ready. This is for preoperative optimization and needs to be started ASAP.   Encouraged patient to follow up with PCP about 2-3 weeks postoperatively to have labs rechecked to ensure that levels are remaining within normal range. Discussed nutritional intake of K+ rich foods as an adjunctive way to keep her K+ levels normal; list of K+ rich foods provided. Also mentioned ORS, however advised her not to rely solely on these drinks, as they are high in Na+, and she has a HTN diagnosis.   Will send copy of this note to surgeon and PCP to make them aware of K+ level and plans for correction. Discussed that PCP may elect to pursue a change in diuretic therapy to a K+ sparing type medication, or alternatively, they may consider adding a daily K+ supplement if levels remain low on recheck. Order entered to recheck K+ on the day of her surgery to ensure optimization. Wished patient the best of luck with her upcoming surgery and subsequent recovery. She was encouraged to return call to the PAT clinic, or to her surgeon's office, should any questions or concerns arise between now and the time of her surgery.   Encounter Diagnoses  Name Primary?   Pre-operative laboratory examination Yes   Diuretic-induced hypokalemia    Long term current use of diuretic    Quentin Mulling, MSN, APRN, FNP-C, CEN [facility]  Peri-operative Services Nurse Practitioner  Phone: [phone] 07/10/22 11:34 AM  NOTE: This note has been prepared using Scientist, clinical (histocompatibility and immunogenetics). Despite my best ability to proofread, there is always the potential that unintentional transcriptional errors may still occur from this process.

## 2022-07-10 NOTE — Addendum Note (Signed)
Addended by: Quentin Mulling on: 07/10/2022 01:46 PM   Modules accepted: Orders

## 2022-07-13 MED ORDER — LACTATED RINGERS IV SOLN: INTRAVENOUS | Status: DC

## 2022-07-13 MED ORDER — ORAL CARE MOUTH RINSE: 15.0000 mL | Freq: Once | OROMUCOSAL | Status: AC

## 2022-07-13 MED ORDER — CEFAZOLIN SODIUM-DEXTROSE 2-4 GM/100ML-% IV SOLN
2.0000 g | INTRAVENOUS | Status: AC
  Administered 2022-07-14: 2 g via INTRAVENOUS

## 2022-07-13 MED ORDER — CHLORHEXIDINE GLUCONATE 0.12 % MT SOLN
15.0000 mL | Freq: Once | OROMUCOSAL | Status: AC
  Administered 2022-07-14: 15 mL via OROMUCOSAL

## 2022-07-14 ENCOUNTER — Encounter: Payer: Self-pay | Admitting: Podiatry

## 2022-07-14 ENCOUNTER — Encounter
Admission: RE | Disposition: A | Discharge status: Home / Self Care | Payer: Self-pay | Source: Home / Self Care | Attending: Podiatry

## 2022-07-14 ENCOUNTER — Ambulatory Visit
Admission: RE | Admit: 2022-07-14 | Discharge: 2022-07-14 | Disposition: A | Discharge status: Home / Self Care | Payer: Medicare HMO | Attending: Podiatry | Admitting: Podiatry

## 2022-07-14 ENCOUNTER — Ambulatory Visit: Payer: Medicare HMO | Admitting: Urgent Care

## 2022-07-14 ENCOUNTER — Other Ambulatory Visit: Payer: Self-pay

## 2022-07-14 DIAGNOSIS — E876 Hypokalemia: Secondary | ICD-10-CM

## 2022-07-14 DIAGNOSIS — I10 Essential (primary) hypertension: Secondary | ICD-10-CM | POA: Diagnosis not present

## 2022-07-14 DIAGNOSIS — M1A9XX1 Chronic gout, unspecified, with tophus (tophi): Secondary | ICD-10-CM | POA: Diagnosis present

## 2022-07-14 DIAGNOSIS — F419 Anxiety disorder, unspecified: Secondary | ICD-10-CM | POA: Insufficient documentation

## 2022-07-14 DIAGNOSIS — Z79899 Other long term (current) drug therapy: Secondary | ICD-10-CM

## 2022-07-14 DIAGNOSIS — Z01812 Encounter for preprocedural laboratory examination: Secondary | ICD-10-CM

## 2022-07-14 DIAGNOSIS — Z87891 Personal history of nicotine dependence: Secondary | ICD-10-CM | POA: Insufficient documentation

## 2022-07-14 DIAGNOSIS — K2100 Gastro-esophageal reflux disease with esophagitis, without bleeding: Secondary | ICD-10-CM | POA: Diagnosis not present

## 2022-07-14 HISTORY — PX: AMPUTATION TOE: SHX6595

## 2022-07-14 LAB — POCT I-STAT, CHEM 8
BUN: 17 mg/dL (ref 8–23)
Calcium, Ion: 1.26 mmol/L (ref 1.15–1.40)
Chloride: 97 mmol/L — ABNORMAL LOW (ref 98–111)
Creatinine, Ser: 1.2 mg/dL — ABNORMAL HIGH (ref 0.44–1.00)
Glucose, Bld: 110 mg/dL — ABNORMAL HIGH (ref 70–99)
HCT: 41 % (ref 36.0–46.0)
Hemoglobin: 13.9 g/dL (ref 12.0–15.0)
Potassium: 3.5 mmol/L (ref 3.5–5.1)
Sodium: 134 mmol/L — ABNORMAL LOW (ref 135–145)
TCO2: 27 mmol/L (ref 22–32)

## 2022-07-14 SURGERY — AMPUTATION, TOE
Anesthesia: General | Site: Toe | Laterality: Left

## 2022-07-14 MED ORDER — ONDANSETRON HCL 4 MG/2ML IJ SOLN: 4.0000 mg | Freq: Four times a day (QID) | INTRAMUSCULAR | Status: DC | PRN

## 2022-07-14 MED ORDER — MIDAZOLAM HCL 2 MG/2ML IJ SOLN
INTRAMUSCULAR | Status: AC
  Filled 2022-07-14: qty 2

## 2022-07-14 MED ORDER — DROPERIDOL 2.5 MG/ML IJ SOLN: 0.6250 mg | Freq: Once | INTRAMUSCULAR | Status: DC | PRN

## 2022-07-14 MED ORDER — MIDAZOLAM HCL 2 MG/2ML IJ SOLN
INTRAMUSCULAR | Status: DC | PRN
  Administered 2022-07-14: 2 mg via INTRAVENOUS

## 2022-07-14 MED ORDER — 0.9 % SODIUM CHLORIDE (POUR BTL) OPTIME
TOPICAL | Status: DC | PRN
  Administered 2022-07-14: 1000 mL

## 2022-07-14 MED ORDER — LIDOCAINE HCL (CARDIAC) PF 100 MG/5ML IV SOSY
PREFILLED_SYRINGE | INTRAVENOUS | Status: DC | PRN
  Administered 2022-07-14: 100 mg via INTRAVENOUS

## 2022-07-14 MED ORDER — FENTANYL CITRATE (PF) 100 MCG/2ML IJ SOLN
INTRAMUSCULAR | Status: AC
  Filled 2022-07-14: qty 2

## 2022-07-14 MED ORDER — CHLORHEXIDINE GLUCONATE 0.12 % MT SOLN
OROMUCOSAL | Status: AC
  Filled 2022-07-14: qty 15

## 2022-07-14 MED ORDER — FENTANYL CITRATE (PF) 100 MCG/2ML IJ SOLN: 25.0000 ug | INTRAMUSCULAR | Status: DC | PRN

## 2022-07-14 MED ORDER — LIDOCAINE HCL (PF) 1 % IJ SOLN
INTRAMUSCULAR | Status: DC | PRN
  Administered 2022-07-14: 10 mL

## 2022-07-14 MED ORDER — BUPIVACAINE HCL 0.5 % IJ SOLN
INTRAMUSCULAR | Status: DC | PRN
  Administered 2022-07-14: 4 mL

## 2022-07-14 MED ORDER — DEXAMETHASONE SODIUM PHOSPHATE 10 MG/ML IJ SOLN
INTRAMUSCULAR | Status: AC
  Filled 2022-07-14: qty 1

## 2022-07-14 MED ORDER — METOCLOPRAMIDE HCL 5 MG/ML IJ SOLN: 5.0000 mg | Freq: Three times a day (TID) | INTRAMUSCULAR | Status: DC | PRN

## 2022-07-14 MED ORDER — PHENYLEPHRINE 80 MCG/ML (10ML) SYRINGE FOR IV PUSH (FOR BLOOD PRESSURE SUPPORT)
PREFILLED_SYRINGE | INTRAVENOUS | Status: AC
  Filled 2022-07-14: qty 10

## 2022-07-14 MED ORDER — PROPOFOL 500 MG/50ML IV EMUL
INTRAVENOUS | Status: DC | PRN
  Administered 2022-07-14: 50 ug/kg/min via INTRAVENOUS

## 2022-07-14 MED ORDER — LIDOCAINE HCL (PF) 1 % IJ SOLN
INTRAMUSCULAR | Status: AC
  Filled 2022-07-14: qty 30

## 2022-07-14 MED ORDER — ONDANSETRON HCL 4 MG/2ML IJ SOLN
INTRAMUSCULAR | Status: AC
  Filled 2022-07-14: qty 2

## 2022-07-14 MED ORDER — METOCLOPRAMIDE HCL 10 MG PO TABS: 5.0000 mg | ORAL_TABLET | Freq: Three times a day (TID) | ORAL | Status: DC | PRN

## 2022-07-14 MED ORDER — DEXAMETHASONE SODIUM PHOSPHATE 10 MG/ML IJ SOLN
INTRAMUSCULAR | Status: DC | PRN
  Administered 2022-07-14: 10 mg via INTRAVENOUS

## 2022-07-14 MED ORDER — CEFAZOLIN SODIUM-DEXTROSE 2-4 GM/100ML-% IV SOLN
INTRAVENOUS | Status: AC
  Filled 2022-07-14: qty 100

## 2022-07-14 MED ORDER — FENTANYL CITRATE (PF) 100 MCG/2ML IJ SOLN
INTRAMUSCULAR | Status: DC | PRN
  Administered 2022-07-14: 25 ug via INTRAVENOUS

## 2022-07-14 MED ORDER — ONDANSETRON HCL 4 MG PO TABS: 4.0000 mg | ORAL_TABLET | Freq: Four times a day (QID) | ORAL | Status: DC | PRN

## 2022-07-14 MED ORDER — ONDANSETRON HCL 4 MG/2ML IJ SOLN
4.0000 mg | Freq: Once | INTRAMUSCULAR | Status: AC
  Administered 2022-07-14: 4 mg via INTRAVENOUS

## 2022-07-14 MED ORDER — LIDOCAINE-EPINEPHRINE 1 %-1:100000 IJ SOLN
INTRAMUSCULAR | Status: AC
  Filled 2022-07-14: qty 1

## 2022-07-14 MED ORDER — BUPIVACAINE HCL (PF) 0.5 % IJ SOLN
INTRAMUSCULAR | Status: AC
  Filled 2022-07-14: qty 30

## 2022-07-14 MED ORDER — PROPOFOL 10 MG/ML IV BOLUS
INTRAVENOUS | Status: AC
  Filled 2022-07-14: qty 20

## 2022-07-14 MED ORDER — LIDOCAINE HCL (PF) 2 % IJ SOLN
INTRAMUSCULAR | Status: AC
  Filled 2022-07-14: qty 5

## 2022-07-14 SURGICAL SUPPLY — 51 items
BLADE OSC/SAGITTAL MD 5.5X18 (BLADE) ×1 IMPLANT
BLADE SURG MINI STRL (BLADE) ×1 IMPLANT
BNDG CMPR 75X21 PLY HI ABS (MISCELLANEOUS) ×1
BNDG CMPR STD VLCR NS LF 5.8X4 (GAUZE/BANDAGES/DRESSINGS) ×1
BNDG ELASTIC 4X5.8 VLCR NS LF (GAUZE/BANDAGES/DRESSINGS) ×1 IMPLANT
BNDG ESMARCH 4 X 12 STRL LF (GAUZE/BANDAGES/DRESSINGS) ×1
BNDG ESMARCH 4X12 STRL LF (GAUZE/BANDAGES/DRESSINGS) ×1 IMPLANT
BNDG GAUZE DERMACEA FLUFF 4 (GAUZE/BANDAGES/DRESSINGS) ×1 IMPLANT
BNDG GZE 12X3 1 PLY HI ABS (GAUZE/BANDAGES/DRESSINGS) ×2
BNDG GZE DERMACEA 4 6PLY (GAUZE/BANDAGES/DRESSINGS) ×1
BNDG STRETCH GAUZE 3IN X12FT (GAUZE/BANDAGES/DRESSINGS) ×2 IMPLANT
CUFF TOURN SGL QUICK 12 (TOURNIQUET CUFF) IMPLANT
CUFF TOURN SGL QUICK 18X4 (TOURNIQUET CUFF) IMPLANT
DRAPE FLUOR MINI C-ARM 54X84 (DRAPES) ×1 IMPLANT
DRAPE XRAY CASSETTE 23X24 (DRAPES) ×1 IMPLANT
DURAPREP 26ML APPLICATOR (WOUND CARE) ×1 IMPLANT
ELECT REM PT RETURN 9FT ADLT (ELECTROSURGICAL) ×1
ELECTRODE REM PT RTRN 9FT ADLT (ELECTROSURGICAL) ×1 IMPLANT
GAUZE PACKING IODOFORM 1/2INX (GAUZE/BANDAGES/DRESSINGS) ×1 IMPLANT
GAUZE SPONGE 4X4 12PLY STRL (GAUZE/BANDAGES/DRESSINGS) ×1 IMPLANT
GAUZE STRETCH 2X75IN STRL (MISCELLANEOUS) ×1 IMPLANT
GAUZE XEROFORM 1X8 LF (GAUZE/BANDAGES/DRESSINGS) ×1 IMPLANT
GLOVE BIO SURGEON STRL SZ7.5 (GLOVE) ×1 IMPLANT
GLOVE INDICATOR 8.0 STRL GRN (GLOVE) ×1 IMPLANT
GOWN SPEC L4 XLG W/TWL (GOWN DISPOSABLE) ×1 IMPLANT
GOWN STRL REUS W/ TWL XL LVL3 (GOWN DISPOSABLE) ×1 IMPLANT
GOWN STRL REUS W/TWL XL LVL3 (GOWN DISPOSABLE) ×1
IV NS IRRIG 3000ML ARTHROMATIC (IV SOLUTION) ×1 IMPLANT
KIT TURNOVER KIT A (KITS) ×1 IMPLANT
LABEL OR SOLS (LABEL) ×1 IMPLANT
MANIFOLD NEPTUNE II (INSTRUMENTS) ×1 IMPLANT
NDL FILTER BLUNT 18X1 1/2 (NEEDLE) ×1 IMPLANT
NDL HYPO 25X1 1.5 SAFETY (NEEDLE) ×1 IMPLANT
NEEDLE FILTER BLUNT 18X1 1/2 (NEEDLE) ×1 IMPLANT
NEEDLE HYPO 25X1 1.5 SAFETY (NEEDLE) ×1 IMPLANT
NS IRRIG 1000ML POUR BTL (IV SOLUTION) IMPLANT
NS IRRIG 500ML POUR BTL (IV SOLUTION) ×1 IMPLANT
PACK EXTREMITY ARMC (MISCELLANEOUS) ×1 IMPLANT
PAD ABD DERMACEA PRESS 5X9 (GAUZE/BANDAGES/DRESSINGS) ×2 IMPLANT
PULSAVAC PLUS IRRIG FAN TIP (DISPOSABLE)
SHIELD FULL FACE ANTIFOG 7M (MISCELLANEOUS) ×1 IMPLANT
STOCKINETTE M/LG 89821 (MISCELLANEOUS) ×1 IMPLANT
STRAP SAFETY 5IN WIDE (MISCELLANEOUS) ×1 IMPLANT
SUT ETHILON 3-0 FS-10 30 BLK (SUTURE) ×1
SUT ETHILON 5-0 FS-2 18 BLK (SUTURE) ×1 IMPLANT
SUT VIC AB 4-0 FS2 27 (SUTURE) ×1 IMPLANT
SUTURE EHLN 3-0 FS-10 30 BLK (SUTURE) ×1 IMPLANT
SYR 10ML LL (SYRINGE) ×3 IMPLANT
TIP FAN IRRIG PULSAVAC PLUS (DISPOSABLE) ×1 IMPLANT
TRAP FLUID SMOKE EVACUATOR (MISCELLANEOUS) ×1 IMPLANT
WATER STERILE IRR 500ML POUR (IV SOLUTION) ×1 IMPLANT

## 2022-07-14 NOTE — Op Note (Signed)
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 that presents today for surgical intervention.  The risks/benefits/alternatives/complications have been discussed and consent has been given.    Procedure:  Patient was brought into the OR and placed on the operating table in thesupine position. After anesthesia was obtained theleft lower extremity was prepped and draped in usual sterile fashion.  Attention was directed to the left third toe where 2 semielliptical incisions were performed circumferential around the third toe.  Full-thickness flaps were created down to the metatarsophalangeal joint.  The toe was then disarticulated at the joint.  This was then removed from the surgical field in toto.  The wound was flushed with copious amounts of irrigation.  Obvious small portions of gout were removed from the surgical site.  At this time layered skin closure was performed with a 3-0 Vicryl the deeper tissue and a 3-0 nylon for skin.  A dry bulky sterile dressing was applied.    Patient tolerated the procedure and anesthesia well.  Was transported from the OR to the PACU with all vital signs stable and vascular status intact. To be discharged per routine protocol.  Will follow up in approximately 1 week in the outpatient clinic.

## 2022-07-14 NOTE — Transfer of Care (Signed)
Immediate Anesthesia Transfer of Care Note  Patient: Donna Lowe  Procedure(s) Performed: 16109 - TOE METATARSOPHALANGEAL JOINT (Left: Toe)  Patient Location: PACU  Anesthesia Type:General  Level of Consciousness: awake, alert , and drowsy  Airway & Oxygen Therapy: Patient Spontanous Breathing and Patient connected to face mask oxygen  Post-op Assessment: Report given to RN and Post -op Vital signs reviewed and stable  Post vital signs: Reviewed and stable  Last Vitals:  Vitals Value Taken Time  BP 175/56 07/14/22 1302  Temp 35.8 1302  Pulse 68 07/14/22 1305  Resp 20 07/14/22 1305  SpO2 100 % 07/14/22 1305  Vitals shown include unvalidated device data.  Last Pain:  Vitals:   07/14/22 1139  TempSrc: Temporal  PainSc: 0-No pain         Complications: No notable events documented.

## 2022-07-14 NOTE — Discharge Instructions (Addendum)
[facility]  POST OPERATIVE INSTRUCTIONS FOR DR. Ether Griffins AND DR. BAKER [facility] CLINIC PODIATRY DEPARTMENT   Take your medication as prescribed.  Pain medication should be taken only as needed.  Keep the dressing clean, dry and intact.  Keep your foot elevated above the heart level for the first 48 hours.  Walking to the bathroom and brief periods of walking are acceptable, unless we have instructed you to be non-weight bearing.  Always wear your post-op shoe when walking.  Always use your crutches if you are to be non-weight bearing.  Do not take a shower. Baths are permissible as long as the foot is kept out of the water.   Every hour you are awake:  Bend your knee 15 times. Flex foot 15 times Massage calf 15 times  Call [facility] [phone]) if any of the following problems occur: You develop a temperature or fever. The bandage becomes saturated with blood. Medication does not stop your pain. Injury of the foot occurs. Any symptoms of infection including redness, odor, or red streaks running from wound.      AMBULATORY SURGERY  DISCHARGE INSTRUCTIONS   The drugs that you were given will stay in your system until tomorrow so for the next 24 hours you should not:  Drive an automobile Make any legal decisions Drink any alcoholic beverage  You may resume regular meals tomorrow.  Today it is better to start with liquids and gradually work up to solid foods.  You may eat anything you prefer, but it is better to start with liquids, then soup and crackers, and gradually work up to solid foods.  Please notify your doctor immediately if you have any unusual bleeding, trouble breathing, redness and pain at the surgery site, drainage, fever, or pain not relieved by medication.  Additional Instructions:  Please contact your physician with any problems or Same Day Surgery at [phone], Monday through Friday 6 am to 4 pm,  or [place] at [facility] number at [phone].amputation

## 2022-07-14 NOTE — H&P (Signed)
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  The various methods of treatment have been discussed with the patient.  No guarantees were given.  After consideration of risks, benefits and other options for treatment, the patient has consented to surgery.  I have reviewed the patients' chart and labs.     A history and physical examination was performed in my office.  The patient was reexamined.  There have been no changes to this history and physical examination.  Donna Lowe A

## 2022-07-14 NOTE — Anesthesia Preprocedure Evaluation (Signed)
Anesthesia Evaluation  Patient identified by MRN, date of birth, ID band Patient awake    Reviewed: Allergy & Precautions, H&P , NPO status , Patient's Chart, lab work & pertinent test results  History of Anesthesia Complications Negative for: history of anesthetic complications  Airway Mallampati: II  TM Distance: >3 FB Neck ROM: full    Dental  (+) Edentulous Upper, Dental Advidsory Given, Missing   Pulmonary neg pulmonary ROS, former smoker   Pulmonary exam normal        Cardiovascular hypertension, On Medications (-) angina (-) Past MI and (-) Cardiac Stents Normal cardiovascular exam(-) dysrhythmias (-) Valvular Problems/Murmurs Rhythm:regular Rate:Normal     Neuro/Psych  Headaches, neg Seizures PSYCHIATRIC DISORDERS Anxiety        GI/Hepatic Neg liver ROS,GERD  ,,  Endo/Other  negative endocrine ROS    Renal/GU negative Renal ROS     Musculoskeletal   Abdominal   Peds  Hematology negative hematology ROS (+)   Anesthesia Other Findings Past Medical History: No date: Anxiety No date: Arthritis     Comment:  hands No date: Dyspnea     Comment:  with anxiety No date: GERD (gastroesophageal reflux disease) No date: Headache     Comment:  "optical" - about 1x/month No date: Hypertension No date: Motion sickness     Comment:  all vehicles unless she is driving No date: Reflux esophagitis No date: Vertigo     Comment:  1-2x/yr No date: Wears dentures     Comment:  full upper, partial lower (doesn't wear lower)   Reproductive/Obstetrics                             Anesthesia Physical Anesthesia Plan  ASA: 2  Anesthesia Plan: General   Post-op Pain Management:    Induction: Intravenous  PONV Risk Score and Plan: Treatment may vary due to age or medical condition, Propofol infusion and TIVA  Airway Management Planned: Natural Airway and Simple Face Mask  Additional  Equipment:   Intra-op Plan:   Post-operative Plan:   Informed Consent: I have reviewed the patients History and Physical, chart, labs and discussed the procedure including the risks, benefits and alternatives for the proposed anesthesia with the patient or authorized representative who has indicated his/her understanding and acceptance.       Plan Discussed with:   Anesthesia Plan Comments:         Anesthesia Quick Evaluation

## 2022-07-14 NOTE — Anesthesia Postprocedure Evaluation (Signed)
Anesthesia Post Note  Patient: Donna Lowe  Procedure(s) Performed: 57846 - TOE METATARSOPHALANGEAL JOINT (Left: Toe)  Patient location during evaluation: PACU Anesthesia Type: General Level of consciousness: awake and alert Pain management: pain level controlled Vital Signs Assessment: post-procedure vital signs reviewed and stable Respiratory status: spontaneous breathing, nonlabored ventilation, respiratory function stable and patient connected to nasal cannula oxygen Cardiovascular status: blood pressure returned to baseline and stable Postop Assessment: no apparent nausea or vomiting Anesthetic complications: no   No notable events documented.   Last Vitals:  Vitals:   07/14/22 1345 07/14/22 1401  BP: (!) 146/60 (!) 163/48  Pulse: 65 67  Resp: 13 18  Temp: (!) 36.1 C (!) 36.4 C  SpO2: 98%     Last Pain:  Vitals:   07/14/22 1401  TempSrc: Temporal  PainSc: 0-No pain                 Lenard Simmer

## 2022-07-15 ENCOUNTER — Encounter: Payer: Self-pay | Admitting: Podiatry

## 2022-09-26 ENCOUNTER — Other Ambulatory Visit: Payer: Self-pay | Admitting: Family Medicine

## 2022-09-26 DIAGNOSIS — Z1231 Encounter for screening mammogram for malignant neoplasm of breast: Secondary | ICD-10-CM

## 2022-10-16 ENCOUNTER — Ambulatory Visit: Payer: Medicare HMO

## 2022-11-15 ENCOUNTER — Ambulatory Visit: Payer: Medicare HMO

## 2022-11-16 ENCOUNTER — Ambulatory Visit
Admission: RE | Admit: 2022-11-16 | Discharge: 2022-11-16 | Disposition: A | Discharge status: Home / Self Care | Payer: Medicare HMO | Source: Ambulatory Visit | Attending: Family Medicine | Admitting: Family Medicine

## 2022-11-16 DIAGNOSIS — Z1231 Encounter for screening mammogram for malignant neoplasm of breast: Secondary | ICD-10-CM | POA: Insufficient documentation

## 2022-12-29 ENCOUNTER — Ambulatory Visit
Admission: RE | Admit: 2022-12-29 | Discharge: 2022-12-29 | Disposition: A | Discharge status: Home / Self Care | Payer: Medicare HMO | Source: Ambulatory Visit | Attending: Family Medicine | Admitting: Family Medicine

## 2022-12-29 ENCOUNTER — Other Ambulatory Visit: Payer: Self-pay | Admitting: Family Medicine

## 2022-12-29 ENCOUNTER — Ambulatory Visit
Admission: RE | Admit: 2022-12-29 | Discharge: 2022-12-29 | Disposition: A | Discharge status: Home / Self Care | Payer: Medicare HMO | Attending: Family Medicine | Admitting: Family Medicine

## 2022-12-29 DIAGNOSIS — M545 Low back pain, unspecified: Secondary | ICD-10-CM

## 2023-04-22 IMAGING — MG MM DIGITAL SCREENING BILAT W/ TOMO AND CAD
8 series · 8 of 24 positions shown · non-contrast
Comparison: Previous exam(s).

CLINICAL DATA: Screening.

EXAM:
DIGITAL SCREENING BILATERAL MAMMOGRAM WITH TOMOSYNTHESIS AND CAD
TECHNIQUE: Bilateral screening digital craniocaudal and mediolateral oblique
mammograms were obtained. Bilateral screening digital breast
tomosynthesis was performed. The images were evaluated with
computer-aided detection.

[R CC synth-2D]
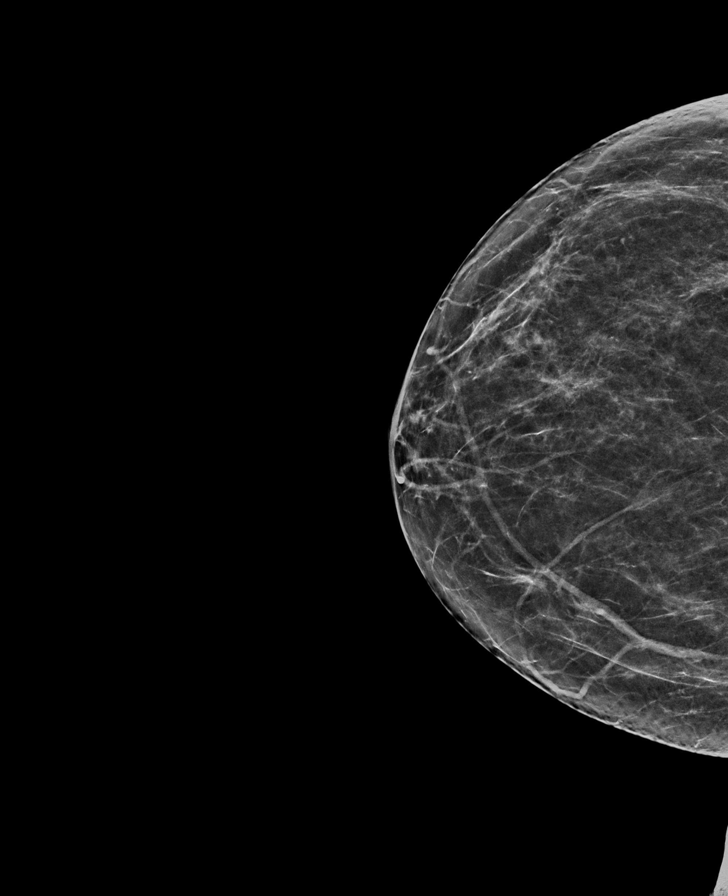

[L CC synth-2D]
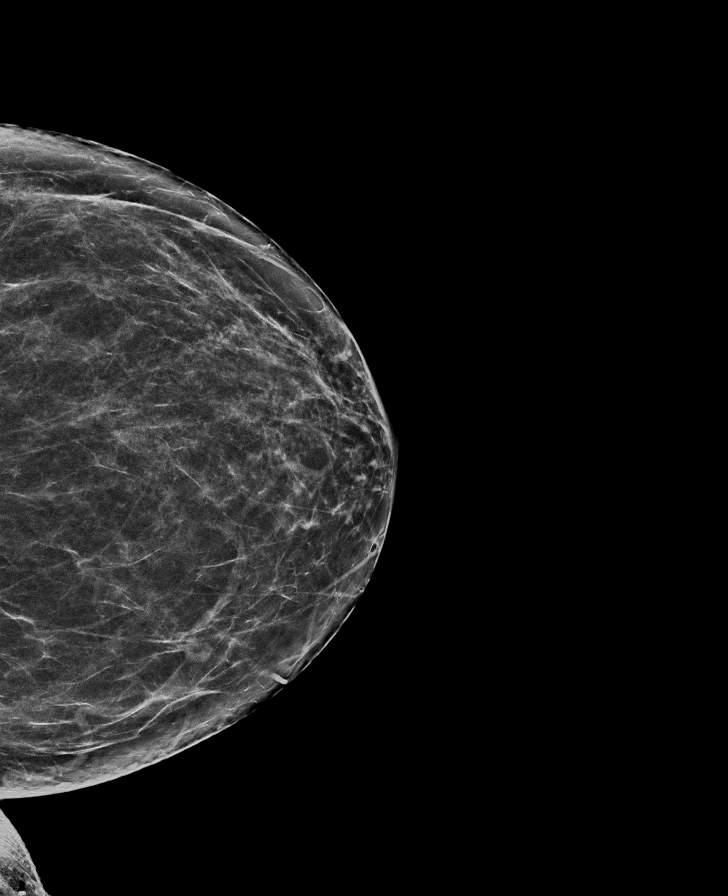

[R MLO synth-2D]
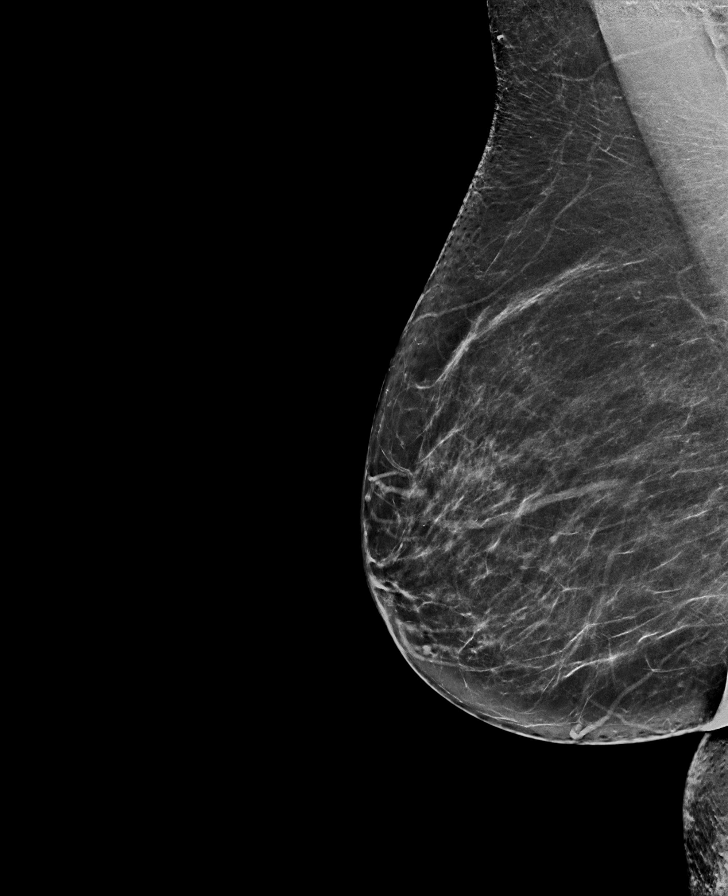

[L MLO synth-2D]
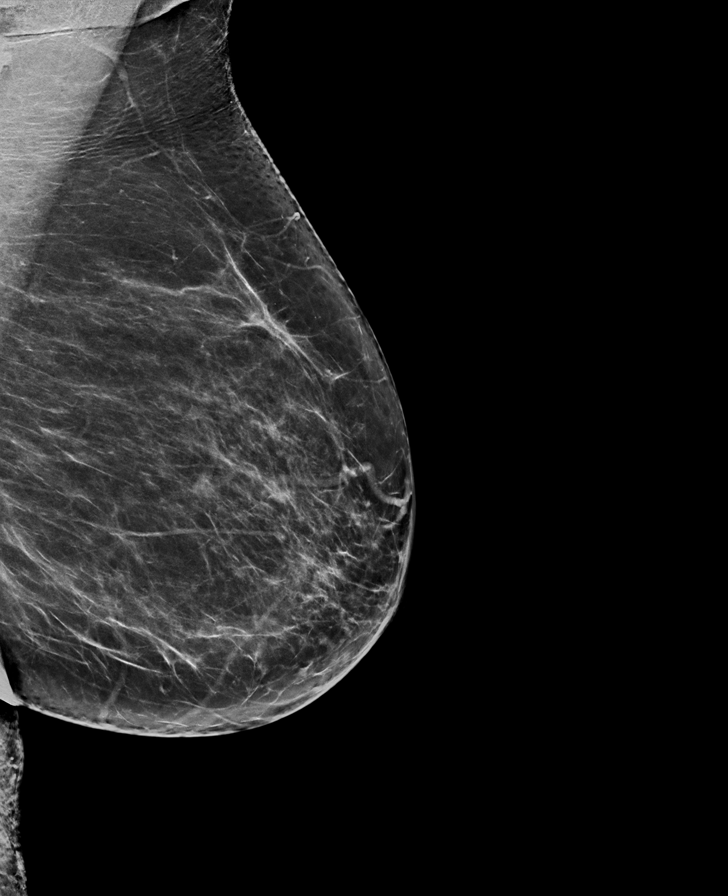

[R MLO tomo · tomo slice 37/72.0]
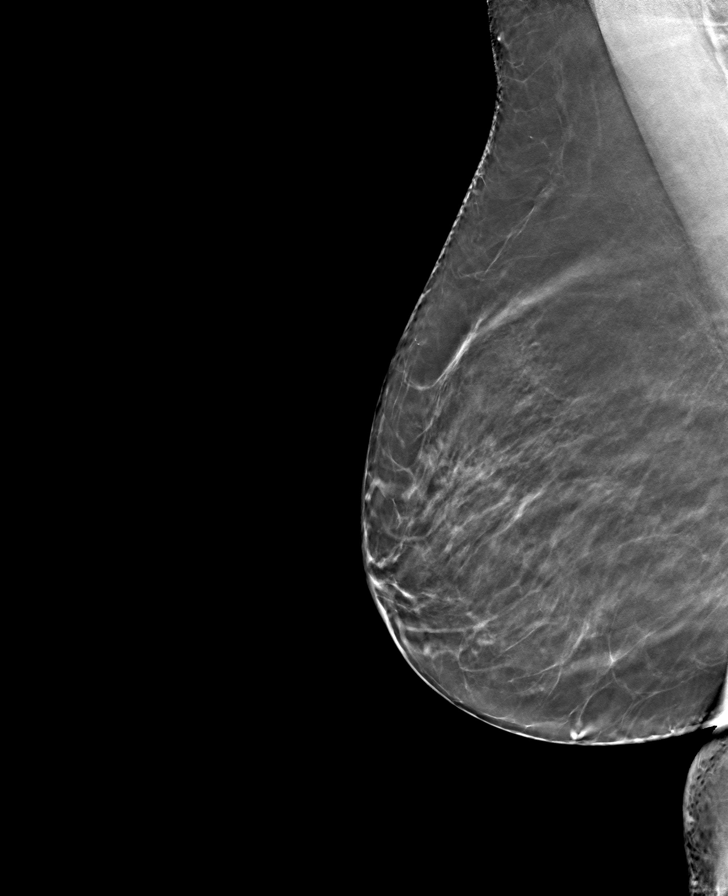

[R CC tomo · tomo slice 31/60.0]
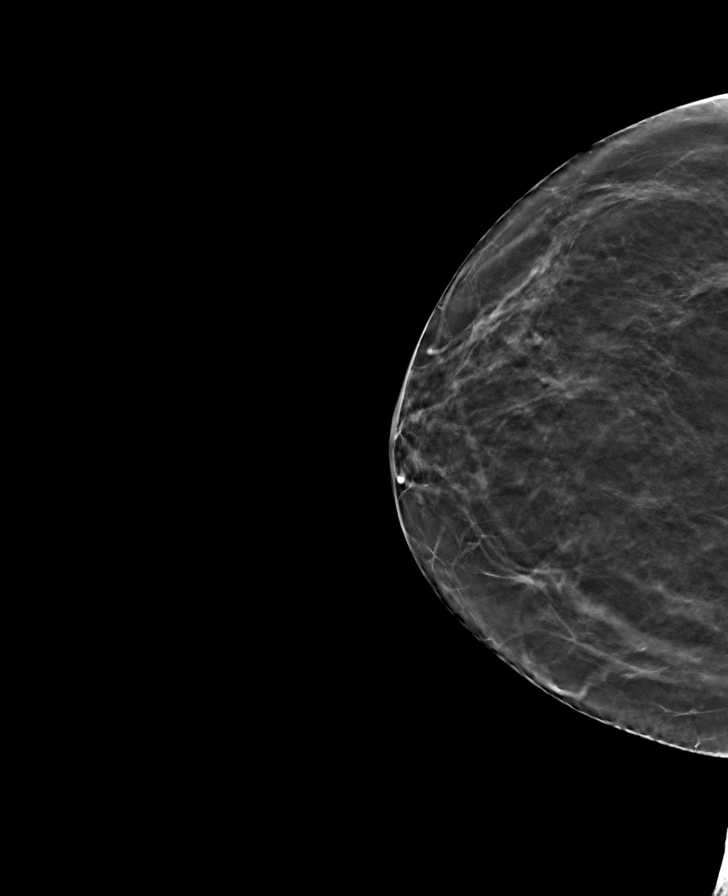

[L MLO tomo · tomo slice 39/77.0]
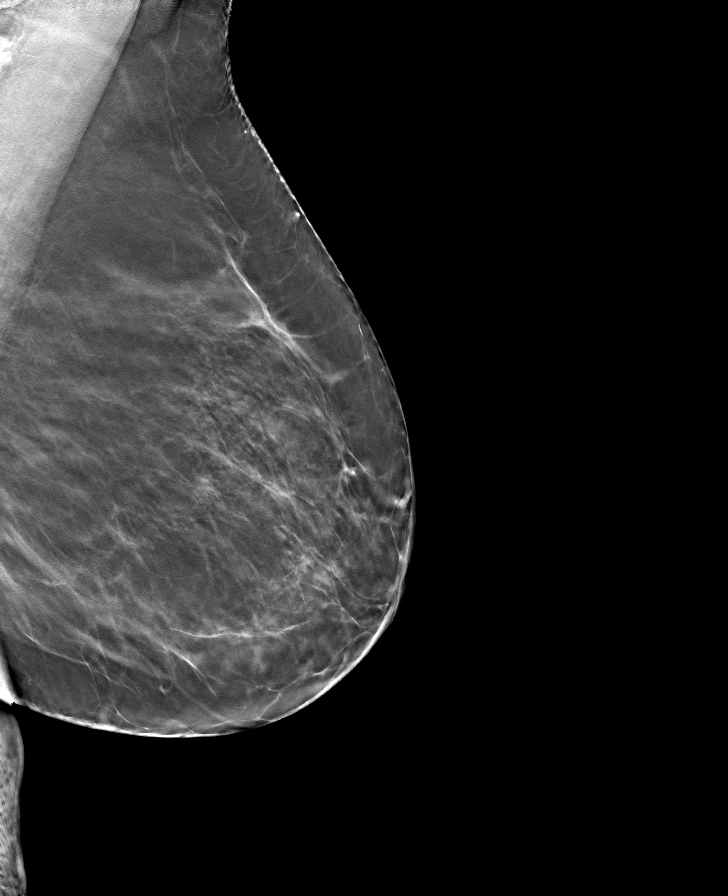

[L CC tomo · tomo slice 33/65.0]
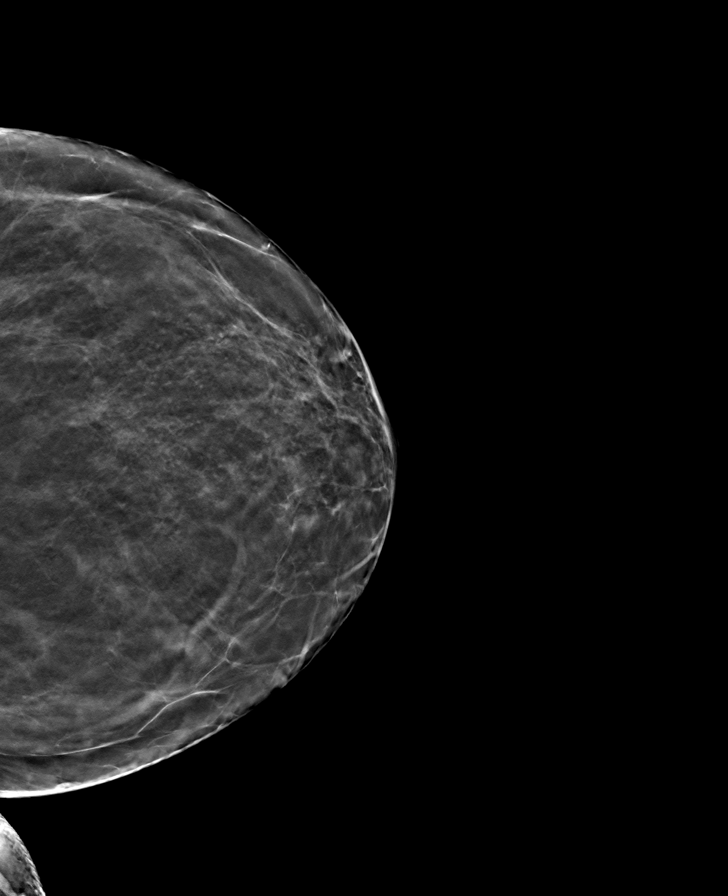

[8 of 24 positions shown; findings below may reference images not displayed]

ACR Breast Density Category b: There are scattered areas of
fibroglandular density.
FINDINGS: There are no findings suspicious for malignancy.
IMPRESSION: No mammographic evidence of malignancy. A result letter of this
screening mammogram will be mailed directly to the patient.

RECOMMENDATION:
Screening mammogram in one year. (Code:51-O-LD2)

BI-RADS CATEGORY  1: Negative.

## 2023-05-21 ENCOUNTER — Other Ambulatory Visit: Payer: Self-pay | Admitting: Family Medicine

## 2023-05-21 ENCOUNTER — Ambulatory Visit
Admission: RE | Admit: 2023-05-21 | Discharge: 2023-05-21 | Disposition: A | Discharge status: Home / Self Care | Source: Ambulatory Visit | Attending: Family Medicine | Admitting: Family Medicine

## 2023-05-21 DIAGNOSIS — M79605 Pain in left leg: Secondary | ICD-10-CM

## 2023-10-15 ENCOUNTER — Other Ambulatory Visit: Payer: Self-pay | Admitting: Family Medicine

## 2023-10-15 DIAGNOSIS — Z1231 Encounter for screening mammogram for malignant neoplasm of breast: Secondary | ICD-10-CM

## 2023-11-21 ENCOUNTER — Ambulatory Visit
Admission: RE | Admit: 2023-11-21 | Discharge: 2023-11-21 | Disposition: A | Discharge status: Home / Self Care | Source: Ambulatory Visit | Attending: Family Medicine | Admitting: Family Medicine

## 2023-11-21 DIAGNOSIS — Z1231 Encounter for screening mammogram for malignant neoplasm of breast: Secondary | ICD-10-CM | POA: Insufficient documentation
